# Patient Record
Sex: Male | Born: 1945 | Race: White | Hispanic: No | Marital: Married | Smoking: Never smoker
Health system: Southern US, Community
[De-identification: ages and names within clinical notes are randomized; demographics above are authoritative.]

## PROBLEM LIST (undated history)

## (undated) DIAGNOSIS — D693 Immune thrombocytopenic purpura: Secondary | ICD-10-CM

## (undated) DIAGNOSIS — R011 Cardiac murmur, unspecified: Secondary | ICD-10-CM

## (undated) DIAGNOSIS — I4891 Unspecified atrial fibrillation: Secondary | ICD-10-CM

## (undated) DIAGNOSIS — I10 Essential (primary) hypertension: Secondary | ICD-10-CM

## (undated) DIAGNOSIS — E119 Type 2 diabetes mellitus without complications: Secondary | ICD-10-CM

## (undated) DIAGNOSIS — D750 Familial erythrocytosis: Secondary | ICD-10-CM

## (undated) HISTORY — DX: Type 2 diabetes mellitus without complications: E11.9

## (undated) HISTORY — DX: Essential (primary) hypertension: I10

## (undated) HISTORY — DX: Unspecified atrial fibrillation: I48.91

## (undated) HISTORY — PX: ROTATOR CUFF REPAIR: SHX139

## (undated) HISTORY — DX: Immune thrombocytopenic purpura: D69.3

## (undated) HISTORY — PX: TONSILLECTOMY: SUR1361

## (undated) HISTORY — PX: HERNIA REPAIR: SHX51

## (undated) HISTORY — DX: Familial erythrocytosis: D75.0

## (undated) HISTORY — PX: CARDIAC ABLATION: SHX51081

## (undated) HISTORY — PX: OTHER SURGICAL HISTORY: SHX169

## (undated) HISTORY — DX: Cardiac murmur, unspecified: R01.1

---

## 1999-02-27 DIAGNOSIS — D693 Immune thrombocytopenic purpura: Secondary | ICD-10-CM

## 1999-02-27 HISTORY — DX: Immune thrombocytopenic purpura: D69.3

## 1999-02-27 HISTORY — PX: SPLENECTOMY, TOTAL: SHX788

## 2001-06-03 ENCOUNTER — Encounter: Admission: RE | Admit: 2001-06-03 | Discharge: 2001-09-01 | Payer: Self-pay | Admitting: Family Medicine

## 2001-09-22 ENCOUNTER — Encounter: Payer: Self-pay | Admitting: General Surgery

## 2001-09-24 ENCOUNTER — Encounter (INDEPENDENT_AMBULATORY_CARE_PROVIDER_SITE_OTHER): Payer: Self-pay | Admitting: Specialist

## 2001-09-24 ENCOUNTER — Inpatient Hospital Stay (HOSPITAL_COMMUNITY): Admission: RE | Admit: 2001-09-24 | Discharge: 2001-09-25 | Payer: Self-pay | Admitting: General Surgery

## 2007-01-02 ENCOUNTER — Ambulatory Visit: Payer: Self-pay | Admitting: Cardiology

## 2007-01-10 ENCOUNTER — Ambulatory Visit: Payer: Self-pay | Admitting: Cardiology

## 2007-01-27 ENCOUNTER — Ambulatory Visit: Payer: Self-pay | Admitting: Cardiology

## 2007-04-01 ENCOUNTER — Ambulatory Visit: Payer: Self-pay | Admitting: Cardiology

## 2007-04-07 ENCOUNTER — Ambulatory Visit: Payer: Self-pay | Admitting: Cardiology

## 2007-04-08 ENCOUNTER — Ambulatory Visit: Payer: Self-pay | Admitting: Cardiology

## 2007-04-14 ENCOUNTER — Ambulatory Visit: Payer: Self-pay | Admitting: Cardiology

## 2007-04-18 ENCOUNTER — Ambulatory Visit: Payer: Self-pay | Admitting: Cardiology

## 2007-04-22 ENCOUNTER — Ambulatory Visit: Payer: Self-pay | Admitting: Cardiology

## 2007-04-23 ENCOUNTER — Ambulatory Visit: Payer: Self-pay | Admitting: Cardiology

## 2007-05-06 ENCOUNTER — Ambulatory Visit: Payer: Self-pay | Admitting: Cardiology

## 2007-05-22 ENCOUNTER — Ambulatory Visit: Payer: Self-pay | Admitting: Cardiology

## 2007-05-29 ENCOUNTER — Ambulatory Visit: Payer: Self-pay | Admitting: Cardiology

## 2007-06-17 ENCOUNTER — Ambulatory Visit: Payer: Self-pay | Admitting: Cardiology

## 2007-07-11 ENCOUNTER — Ambulatory Visit: Payer: Self-pay | Admitting: Cardiology

## 2007-07-29 ENCOUNTER — Ambulatory Visit: Payer: Self-pay | Admitting: Cardiology

## 2007-08-18 ENCOUNTER — Ambulatory Visit: Payer: Self-pay | Admitting: Cardiology

## 2007-09-15 ENCOUNTER — Ambulatory Visit: Payer: Self-pay | Admitting: Cardiology

## 2007-10-22 ENCOUNTER — Ambulatory Visit: Payer: Self-pay | Admitting: Cardiology

## 2007-10-29 ENCOUNTER — Ambulatory Visit: Payer: Self-pay | Admitting: Cardiology

## 2007-11-05 ENCOUNTER — Ambulatory Visit: Payer: Self-pay | Admitting: Cardiology

## 2007-12-02 ENCOUNTER — Ambulatory Visit: Payer: Self-pay | Admitting: Cardiology

## 2007-12-05 ENCOUNTER — Encounter: Payer: Self-pay | Admitting: Cardiology

## 2007-12-09 ENCOUNTER — Ambulatory Visit: Payer: Self-pay | Admitting: Cardiology

## 2007-12-15 ENCOUNTER — Ambulatory Visit: Payer: Self-pay | Admitting: Cardiology

## 2008-01-06 ENCOUNTER — Ambulatory Visit: Payer: Self-pay | Admitting: Cardiology

## 2008-01-30 ENCOUNTER — Ambulatory Visit: Payer: Self-pay | Admitting: Cardiology

## 2008-02-06 ENCOUNTER — Ambulatory Visit: Payer: Self-pay | Admitting: Cardiology

## 2008-02-09 ENCOUNTER — Encounter: Payer: Self-pay | Admitting: Cardiology

## 2008-03-05 ENCOUNTER — Ambulatory Visit: Payer: Self-pay | Admitting: Cardiology

## 2008-03-18 ENCOUNTER — Ambulatory Visit: Payer: Self-pay | Admitting: Cardiology

## 2008-04-20 ENCOUNTER — Ambulatory Visit: Payer: Self-pay | Admitting: Cardiology

## 2008-05-14 ENCOUNTER — Ambulatory Visit: Payer: Self-pay | Admitting: Cardiology

## 2008-05-31 ENCOUNTER — Ambulatory Visit: Payer: Self-pay | Admitting: Cardiology

## 2008-06-01 ENCOUNTER — Ambulatory Visit: Payer: Self-pay | Admitting: Cardiology

## 2008-06-22 ENCOUNTER — Ambulatory Visit: Payer: Self-pay | Admitting: Cardiology

## 2008-06-26 ENCOUNTER — Encounter: Payer: Self-pay | Admitting: Cardiology

## 2008-07-13 ENCOUNTER — Ambulatory Visit: Payer: Self-pay | Admitting: Cardiology

## 2008-08-10 ENCOUNTER — Ambulatory Visit: Payer: Self-pay | Admitting: Cardiology

## 2008-08-20 ENCOUNTER — Encounter: Payer: Self-pay | Admitting: Cardiology

## 2008-09-03 ENCOUNTER — Ambulatory Visit: Payer: Self-pay | Admitting: Cardiology

## 2008-09-23 ENCOUNTER — Ambulatory Visit: Payer: Self-pay | Admitting: Cardiology

## 2008-10-01 ENCOUNTER — Ambulatory Visit: Payer: Self-pay | Admitting: Cardiology

## 2008-10-11 ENCOUNTER — Encounter: Payer: Self-pay | Admitting: *Deleted

## 2008-10-22 DIAGNOSIS — I480 Paroxysmal atrial fibrillation: Secondary | ICD-10-CM

## 2008-10-27 ENCOUNTER — Encounter: Payer: Self-pay | Admitting: Cardiology

## 2008-11-02 ENCOUNTER — Ambulatory Visit: Payer: Self-pay | Admitting: Cardiology

## 2008-11-02 LAB — CONVERTED CEMR LAB: POC INR: 2.7

## 2008-11-30 ENCOUNTER — Ambulatory Visit: Payer: Self-pay | Admitting: Cardiology

## 2008-11-30 LAB — CONVERTED CEMR LAB: POC INR: 2.6

## 2009-01-07 ENCOUNTER — Ambulatory Visit: Payer: Self-pay | Admitting: Cardiology

## 2009-01-07 LAB — CONVERTED CEMR LAB: POC INR: 4.3

## 2009-01-25 ENCOUNTER — Ambulatory Visit: Payer: Self-pay | Admitting: Cardiology

## 2009-01-25 LAB — CONVERTED CEMR LAB: POC INR: 2.6

## 2009-01-26 ENCOUNTER — Encounter: Payer: Self-pay | Admitting: Cardiology

## 2009-02-02 ENCOUNTER — Encounter: Payer: Self-pay | Admitting: Cardiology

## 2009-02-22 ENCOUNTER — Ambulatory Visit: Payer: Self-pay | Admitting: Cardiology

## 2009-02-22 LAB — CONVERTED CEMR LAB: POC INR: 3

## 2009-03-29 ENCOUNTER — Ambulatory Visit: Payer: Self-pay | Admitting: Cardiology

## 2009-03-29 LAB — CONVERTED CEMR LAB: POC INR: 1.8

## 2009-04-27 ENCOUNTER — Encounter: Payer: Self-pay | Admitting: Cardiology

## 2009-04-29 ENCOUNTER — Ambulatory Visit: Payer: Self-pay | Admitting: Cardiology

## 2009-04-29 LAB — CONVERTED CEMR LAB: POC INR: 2.1

## 2009-05-30 ENCOUNTER — Encounter: Payer: Self-pay | Admitting: Cardiology

## 2009-05-30 ENCOUNTER — Ambulatory Visit: Payer: Self-pay | Admitting: Cardiology

## 2009-05-30 LAB — CONVERTED CEMR LAB: POC INR: 2.4

## 2009-05-31 ENCOUNTER — Encounter: Payer: Self-pay | Admitting: Cardiology

## 2009-06-28 ENCOUNTER — Ambulatory Visit: Payer: Self-pay | Admitting: Cardiology

## 2009-06-28 LAB — CONVERTED CEMR LAB: POC INR: 3.4

## 2009-07-21 ENCOUNTER — Encounter: Payer: Self-pay | Admitting: Cardiology

## 2009-07-29 ENCOUNTER — Ambulatory Visit: Payer: Self-pay | Admitting: Cardiology

## 2009-07-29 LAB — CONVERTED CEMR LAB: POC INR: 2.4

## 2009-08-26 ENCOUNTER — Ambulatory Visit: Payer: Self-pay | Admitting: Cardiology

## 2009-08-26 LAB — CONVERTED CEMR LAB: POC INR: 2.2

## 2009-09-23 ENCOUNTER — Ambulatory Visit: Payer: Self-pay | Admitting: Cardiology

## 2009-09-26 ENCOUNTER — Encounter (INDEPENDENT_AMBULATORY_CARE_PROVIDER_SITE_OTHER): Payer: Self-pay | Admitting: *Deleted

## 2009-09-28 ENCOUNTER — Encounter: Payer: Self-pay | Admitting: Cardiology

## 2009-09-29 ENCOUNTER — Encounter: Payer: Self-pay | Admitting: Cardiology

## 2009-10-21 ENCOUNTER — Ambulatory Visit: Payer: Self-pay | Admitting: Cardiology

## 2009-10-21 LAB — CONVERTED CEMR LAB: POC INR: 3.1

## 2009-11-18 ENCOUNTER — Ambulatory Visit: Payer: Self-pay | Admitting: Cardiology

## 2009-11-18 LAB — CONVERTED CEMR LAB: POC INR: 2.9

## 2009-12-04 ENCOUNTER — Encounter: Payer: Self-pay | Admitting: Cardiology

## 2009-12-06 ENCOUNTER — Ambulatory Visit: Payer: Self-pay | Admitting: Cardiology

## 2009-12-06 ENCOUNTER — Encounter (INDEPENDENT_AMBULATORY_CARE_PROVIDER_SITE_OTHER): Payer: Self-pay | Admitting: *Deleted

## 2009-12-06 DIAGNOSIS — I10 Essential (primary) hypertension: Secondary | ICD-10-CM | POA: Insufficient documentation

## 2009-12-09 ENCOUNTER — Encounter: Payer: Self-pay | Admitting: Cardiology

## 2009-12-12 ENCOUNTER — Ambulatory Visit: Payer: Self-pay | Admitting: Cardiology

## 2009-12-12 ENCOUNTER — Encounter: Payer: Self-pay | Admitting: Cardiology

## 2009-12-13 ENCOUNTER — Encounter (INDEPENDENT_AMBULATORY_CARE_PROVIDER_SITE_OTHER): Payer: Self-pay | Admitting: *Deleted

## 2009-12-16 ENCOUNTER — Ambulatory Visit: Payer: Self-pay | Admitting: Cardiology

## 2010-01-24 ENCOUNTER — Ambulatory Visit: Payer: Self-pay | Admitting: Cardiology

## 2010-02-24 ENCOUNTER — Ambulatory Visit: Payer: Self-pay | Admitting: Cardiology

## 2010-02-24 LAB — CONVERTED CEMR LAB: POC INR: 3.1

## 2010-03-24 ENCOUNTER — Ambulatory Visit: Admission: RE | Admit: 2010-03-24 | Discharge: 2010-03-24 | Payer: Self-pay | Source: Home / Self Care

## 2010-03-24 LAB — CONVERTED CEMR LAB: POC INR: 2.4

## 2010-03-28 NOTE — Letter (Signed)
Summary: Internal Correspondence  Internal Correspondence   Imported By: Zachary George 12/09/2009 10:30:36  _____________________________________________________________________  External Attachment:    Type:   Image     Comment:   External Document

## 2010-03-28 NOTE — Medication Information (Signed)
Summary: ccr-lr  Anticoagulant Therapy  Managed by: Vashti Hey, RN PCP: Dr. Wyvonnia Lora Supervising MD: Myrtis Ser MD, Tinnie Gens Indication 1: Atrial Fibrillation (ICD-427.31) Lab [facility] [place] Site: [place] INR POC 3.1  Dietary changes: no    Health status changes: no    Bleeding/hemorrhagic complications: no    Recent/future hospitalizations: no    Any changes in medication regimen? no    Recent/future dental: no  Any missed doses?: no       Is patient compliant with meds? yes       Allergies: No Known Drug Allergies  Anticoagulation Management History:      The patient is taking warfarin and comes in today for a routine follow up visit.  Negative risk factors for bleeding include an age less than 22 years old.  The bleeding index is 'low risk'.  Negative CHADS2 values include Age > 50 years old.  The start date was 04/01/2007.  Anticoagulation responsible provider: Myrtis Ser MD, Tinnie Gens.  INR POC: 3.1.  Cuvette Lot#: [id number].  Exp: 10/11.    Anticoagulation Management Assessment/Plan:      The patient's current anticoagulation dose is Warfarin sodium 2.5 mg tabs: Use as directed by Anticoagualtion Clinic.  The target INR is 2 - 3.  The next INR is due 11/18/2009.  Anticoagulation instructions were given to patient.  Results were reviewed/authorized by Vashti Hey, RN.  He was notified by Vashti Hey RN.         Prior Anticoagulation Instructions: INR 2.6 Continue coumadin 7.5mg  once daily except 5mg  on M,F  Current Anticoagulation Instructions: INR 3.1 Continue coumadin 7.5mg  once daily except 5mg  on Mondays and Fridays

## 2010-03-28 NOTE — Medication Information (Signed)
Summary: ccr-lr pt r/s  Anticoagulant Therapy  Managed by: Vashti Hey, RN PCP: Dr. Wyvonnia Lora Supervising MD: Andee Lineman MD, Michelle Piper Indication 1: Atrial Fibrillation (ICD-427.31) Lab [facility]: [facility] [place] Site: [place] INR POC 2.9  Dietary changes: no    Health status changes: no    Bleeding/hemorrhagic complications: no    Recent/future hospitalizations: no    Any changes in medication regimen? no    Recent/future dental: no  Any missed doses?: no       Is patient compliant with meds? yes       Allergies: No Known Drug Allergies  Anticoagulation Management History:      The patient is taking warfarin and comes in today for a routine follow up visit.  Negative risk factors for bleeding include an age less than 34 years old.  The bleeding index is 'low risk'.  Negative CHADS2 values include Age > 48 years old.  The start date was 04/01/2007.  Anticoagulation responsible Mikelle Myrick: Andee Lineman MD, Michelle Piper.  INR POC: 2.9.  Cuvette Lot#: [id number].  Exp: 10/11.    Anticoagulation Management Assessment/Plan:      The patient's current anticoagulation dose is Warfarin sodium 2.5 mg tabs: Use as directed by Anticoagualtion Clinic.  The target INR is 2 - 3.  The next INR is due 12/16/2009.  Anticoagulation instructions were given to patient.  Results were reviewed/authorized by Vashti Hey, RN.  He was notified by Vashti Hey RN.         Prior Anticoagulation Instructions: INR 3.1 Continue coumadin 7.5mg  once daily except 5mg  on Mondays and Fridays  Current Anticoagulation Instructions: INR 2.9 Continue coumadin 7.5mg  once daily except 5mg  on Mondays and Fridays

## 2010-03-28 NOTE — Letter (Signed)
Summary: External Correspondence/ FAXED DR. HRANITZKY AT [facility]  External Correspondence/ FAXED DR. HRANITZKY AT [facility]   Imported By: Dorise Hiss 09/29/2009 08:55:04  _____________________________________________________________________  External Attachment:    Type:   Image     Comment:   External Document

## 2010-03-28 NOTE — Medication Information (Signed)
Summary: CCR PER 4/4 CHECK-JM  Anticoagulant Therapy  Managed by: Vashti Hey, RN PCP: Dr. Wyvonnia Lora Supervising MD: Diona Browner MD, Remi Deter Indication 1: Atrial Fibrillation (ICD-427.31) Lab [facility] [place] Site: [place] INR POC 3.4  Dietary changes: no    Health status changes: no    Bleeding/hemorrhagic complications: no    Recent/future hospitalizations: no    Any changes in medication regimen? no    Recent/future dental: no  Any missed doses?: no       Is patient compliant with meds? yes       Allergies: No Known Drug Allergies  Anticoagulation Management History:      The patient is taking warfarin and comes in today for a routine follow up visit.  Negative risk factors for bleeding include an age less than 51 years old.  The bleeding index is 'low risk'.  Negative CHADS2 values include Age > 15 years old.  The start date was 04/01/2007.  Anticoagulation responsible provider: Diona Browner MD, Remi Deter.  INR POC: 3.4.  Cuvette Lot#: [id number].  Exp: 10/11.    Anticoagulation Management Assessment/Plan:      The patient's current anticoagulation dose is Warfarin sodium 2.5 mg tabs: Use as directed by Anticoagualtion Clinic.  The target INR is 2 - 3.  The next INR is due 07/29/2009.  Anticoagulation instructions were given to patient.  Results were reviewed/authorized by Vashti Hey, RN.  He was notified by Vashti Hey RN.         Prior Anticoagulation Instructions: INR 2.4 Continue coumadin 7.5mg  once daily except 5mg  on Mondays and Fridays  Current Anticoagulation Instructions: INR 3.4 Take coumadin 2.5mg  tonight then resume 7.5mg  once daily except 5mg  on M,F

## 2010-03-28 NOTE — Miscellaneous (Signed)
Summary: Orders Update  Clinical Lists Changes  Orders: Added new Test order of T-Hepatic Function [phone]) - Signed Added new Test order of T-CBC No Diff ([id number]) - Signed

## 2010-03-28 NOTE — Letter (Signed)
Summary: External Correspondence/ OFFICE NOTE DR. HRANITZKY  External Correspondence/ OFFICE NOTE DR. HRANITZKY   Imported By: Dorise Hiss 05/02/2009 15:47:44  _____________________________________________________________________  External Attachment:    Type:   Image     Comment:   External Document

## 2010-03-28 NOTE — Letter (Signed)
Summary: Lexiscan or Dobutamine Pharmacist, community at [facility]  [address]   [place], [place] [address]   Phone: [phone]  Fax: [phone]      [facility] Cardiovascular Services  Lexiscan or Dobutamine Cardiolite Strss Test    Daxter Demorest  Appointment Date:_  Appointment Time:_  Your doctor has ordered a CARDIOLITE STRESS TEST using a medication to stimulate exercise so that you will not have to walk on the treadmill to determine the condition of your heart during stress. If you take blood pressure medication, ask your doctor if you should take it the day of your test. You should not have anything to eat or drink at least 4 hours before your test is scheduled, and no caffeine, including decaffeinated tea and coffee, chocolate, and soft drinks for 24 hours before your test.  You will need to register at the Outpatient/Main Entrance at the hospital 15 minutes before your appointment time. It is a good idea to bring a copy of your order with you. They will direct you to the Diagnostic Imaging (Radiology) Department.  You will be asked to undress from the waist up and given a hospital gown to wear, so dress comfortably from the waist down for example: Sweat pants, shorts, or skirt Rubber soled lace up shoes (tennis shoes)  Plan on about three hours from registration to release from the hospital  You may take your medications with water the morning of your test.  If the results of your test are normal or stable, you will receive a letter. If they are abnormal, the nurse will contact you by phone.

## 2010-03-28 NOTE — Letter (Signed)
Summary: Engineer, materials at [facility]  [address]   [place], [place] [address]   Phone: [phone]  Fax: [phone]        December 13, 2009 MRN: [id number]    [name] [address] [place], [place]  [address]    Dear [name],  Your test ordered by Selena Batten has been reviewed by your physician (or physician assistant) and was found to be normal or stable. Your physician (or physician assistant) felt no changes were needed at this time.  ____ Echocardiogram  __X__ Cardiac Stress Test  ____ Lab Work  ____ Peripheral vascular study of arms, legs or neck  ____ CT scan or X-ray  ____ Lung or Breathing test  ____ Other:    Thank you.   Cyril Loosen, RN, BSN    Duane Boston, M.D., F.A.C.C. Thressa Sheller, M.D., F.A.C.C. Oneal Grout, M.D., F.A.C.C. Cheree Ditto, M.D., F.A.C.C. Daiva Nakayama, M.D., F.A.C.C. Kenney Houseman, M.D., F.A.C.C. Jeanne Ivan, PA-C

## 2010-03-28 NOTE — Medication Information (Signed)
Summary: ccr-lr  Anticoagulant Therapy  Managed by: Richard Hey, RN PCP: Daine Gravel MD: Andee Lineman MD, Michelle Piper Indication 1: Atrial Fibrillation (ICD-427.31) Lab [facility] [place] Site: [place] INR POC 2.1  Dietary changes: no    Health status changes: no    Bleeding/hemorrhagic complications: no    Recent/future hospitalizations: no    Any changes in medication regimen? no    Recent/future dental: no  Any missed doses?: no       Is patient compliant with meds? yes       Anticoagulation Management History:      The patient is taking warfarin and comes in today for a routine follow up visit.  Negative risk factors for bleeding include an age less than 33 years old.  The bleeding index is 'low risk'.  Negative CHADS2 values include Age > 66 years old.  The start date was 04/01/2007.  Anticoagulation responsible provider: Andee Lineman MD, Michelle Piper.  INR POC: 2.1.  Cuvette Lot#: [id number].  Exp: 10/11.    Anticoagulation Management Assessment/Plan:      The patient's current anticoagulation dose is Warfarin sodium 2.5 mg tabs: Use as directed by Anticoagualtion Clinic.  The target INR is 2 - 3.  The next INR is due 05/31/2009.  Anticoagulation instructions were given to patient.  Results were reviewed/authorized by Richard Hey, RN.  He was notified by Richard Hey RN.         Prior Anticoagulation Instructions: INR 1.8 Take coumadin 4 tablets tonight and tomorrow night then resume 7.5mg  once daily except 5mg  on M,W,F  Current Anticoagulation Instructions: INR 2.1 Increase coumadin to 7.5mg  once daily except 5mg  on Mondays and Fridays

## 2010-03-28 NOTE — Procedures (Signed)
Summary: Holter and Event/ CARDIONET  Holter and Event/ CARDIONET   Imported By: Dorise Hiss 05/27/2009 14:24:20  _____________________________________________________________________  External Attachment:    Type:   Image     Comment:   External Document

## 2010-03-28 NOTE — Medication Information (Signed)
Summary: ccr-lr  Anticoagulant Therapy  Managed by: Vashti Hey, RN PCP: Dr. Wyvonnia Lora Supervising MD: Diona Browner MD, Remi Deter Indication 1: Atrial Fibrillation (ICD-427.31) Lab [facility] [place] Site: [place] INR POC 2.6  Dietary changes: no    Health status changes: no    Bleeding/hemorrhagic complications: no    Recent/future hospitalizations: no    Any changes in medication regimen? no    Recent/future dental: no  Any missed doses?: no       Is patient compliant with meds? yes       Allergies: No Known Drug Allergies  Anticoagulation Management History:      The patient is taking warfarin and comes in today for a routine follow up visit.  Negative risk factors for bleeding include an age less than 61 years old.  The bleeding index is 'low risk'.  Negative CHADS2 values include Age > 71 years old.  The start date was 04/01/2007.  Anticoagulation responsible provider: Diona Browner MD, Remi Deter.  INR POC: 2.6.  Cuvette Lot#: [id number].  Exp: 10/11.    Anticoagulation Management Assessment/Plan:      The patient's current anticoagulation dose is Warfarin sodium 2.5 mg tabs: Use as directed by Anticoagualtion Clinic.  The target INR is 2 - 3.  The next INR is due 10/21/2009.  Anticoagulation instructions were given to patient.  Results were reviewed/authorized by Vashti Hey, RN.  He was notified by Vashti Hey RN.         Prior Anticoagulation Instructions: INR 2.2 Continue coumadin 7.5mg  once daily except 5mg  on Mondays and Fridays  Current Anticoagulation Instructions: INR 2.6 Continue coumadin 7.5mg  once daily except 5mg  on M,F

## 2010-03-28 NOTE — Medication Information (Signed)
Summary: ccr-lr  Anticoagulant Therapy  Managed by: Vashti Hey, RN PCP: Daine Gravel MD: Andee Lineman MD, Michelle Piper Indication 1: Atrial Fibrillation (ICD-427.31) Lab [facility] [place] Site: [place] INR POC 1.8  Dietary changes: no    Health status changes: no    Bleeding/hemorrhagic complications: no    Recent/future hospitalizations: no    Any changes in medication regimen? no    Recent/future dental: no  Any missed doses?: no       Is patient compliant with meds? yes       Anticoagulation Management History:      The patient is taking warfarin and comes in today for a routine follow up visit.  Negative risk factors for bleeding include an age less than 40 years old.  The bleeding index is 'low risk'.  Negative CHADS2 values include Age > 72 years old.  The start date was 04/01/2007.  Anticoagulation responsible provider: Andee Lineman MD, Michelle Piper.  INR POC: 1.8.  Cuvette Lot#: [id number].  Exp: 10/11.    Anticoagulation Management Assessment/Plan:      The patient's current anticoagulation dose is Warfarin sodium 2.5 mg tabs: Use as directed by Anticoagualtion Clinic.  The target INR is 2 - 3.  The next INR is due 04/19/2009.  Anticoagulation instructions were given to patient.  Results were reviewed/authorized by Vashti Hey, RN.  He was notified by Vashti Hey RN.         Prior Anticoagulation Instructions: INR 3.0 Continue coumadin 7.5mg  once daily except 5mg  on M,W,F  Current Anticoagulation Instructions: INR 1.8 Take coumadin 4 tablets tonight and tomorrow night then resume 7.5mg  once daily except 5mg  on M,W,F

## 2010-03-28 NOTE — Medication Information (Signed)
Summary: ccr-lr  Anticoagulant Therapy  Managed by: Vashti Hey, RN PCP: Dr. Wyvonnia Lora Supervising MD: Andee Lineman MD, Michelle Piper Indication 1: Atrial Fibrillation (ICD-427.31) Lab [facility] [place] Site: [place] INR POC 2.4  Dietary changes: no    Health status changes: yes       Details: fell off ladder  Hit head, had hematoma on rt arm and pelvic area  Went to ED and had CT scan   scan was negative  Bleeding/hemorrhagic complications: no    Recent/future hospitalizations: yes       Details: At [facility] ED for Ct scans  all negative  Any changes in medication regimen? no    Recent/future dental: no  Any missed doses?: yes     Details: coumadin was held 1 night  Is patient compliant with meds? yes       Allergies: No Known Drug Allergies  Anticoagulation Management History:      The patient is taking warfarin and comes in today for a routine follow up visit.  Negative risk factors for bleeding include an age less than 73 years old.  The bleeding index is 'low risk'.  Negative CHADS2 values include Age > 63 years old.  The start date was 04/01/2007.  Anticoagulation responsible provider: Andee Lineman MD, Michelle Piper.  INR POC: 2.4.  Cuvette Lot#: [id number].  Exp: 10/11.    Anticoagulation Management Assessment/Plan:      The patient's current anticoagulation dose is Warfarin sodium 2.5 mg tabs: Use as directed by Anticoagualtion Clinic.  The target INR is 2 - 3.  The next INR is due 08/26/2009.  Anticoagulation instructions were given to patient.  Results were reviewed/authorized by Vashti Hey, RN.  He was notified by Vashti Hey RN.         Prior Anticoagulation Instructions: INR 3.4 Take coumadin 2.5mg  tonight then resume 7.5mg  once daily except 5mg  on M,F  Current Anticoagulation Instructions: INR 2.4 Continue coumadin 7.5mg  once daily except 5mg  on Mondays and Fridays

## 2010-03-28 NOTE — Miscellaneous (Signed)
Summary: Orders Update  Clinical Lists Changes  Orders: Added new Referral order of GXT (GXT) - Signed 

## 2010-03-28 NOTE — Medication Information (Signed)
Summary: Coumadin Clinic  Anticoagulant Therapy  Managed by: Vashti Hey, RN PCP: Daine Gravel MD: Antoine Poche MD, Fayrene Fearing Indication 1: Atrial Fibrillation (ICD-427.31) Lab Used: [facility] [place] Site: [place] INR POC 2.4  Dietary changes: no    Health status changes: no    Bleeding/hemorrhagic complications: no    Recent/future hospitalizations: no    Any changes in medication regimen? no    Recent/future dental: no  Any missed doses?: no       Is patient compliant with meds? yes       Allergies: No Known Drug Allergies  Anticoagulation Management History:      The patient is taking warfarin and comes in today for a routine follow up visit.  Negative risk factors for bleeding include an age less than 58 years old.  The bleeding index is 'low risk'.  Negative CHADS2 values include Age > 29 years old.  The start date was 04/01/2007.  Anticoagulation responsible provider: Antoine Poche MD, Fayrene Fearing.  INR POC: 2.4.  Cuvette Lot#: [id number].  Exp: 10/11.    Anticoagulation Management Assessment/Plan:      The patient's current anticoagulation dose is Warfarin sodium 2.5 mg tabs: Use as directed by Anticoagualtion Clinic.  The target INR is 2 - 3.  The next INR is due 06/28/2009.  Anticoagulation instructions were given to patient.  Results were reviewed/authorized by Vashti Hey, RN.  He was notified by Vashti Hey RN.         Prior Anticoagulation Instructions: INR 2.1 Increase coumadin to 7.5mg  once daily except 5mg  on Mondays and Fridays  Current Anticoagulation Instructions: INR 2.4 Continue coumadin 7.5mg  once daily except 5mg  on Mondays and Fridays

## 2010-03-28 NOTE — Assessment & Plan Note (Signed)
Summary: 6 mo  Medications Added LOSARTAN POTASSIUM 100 MG TABS (LOSARTAN POTASSIUM) Take 1 tablet by mouth once a day      Allergies Added: NKDA  Visit Type:  Follow-up Primary Provider:  Dr. Wyvonnia Lora   History of Present Illness: 65 year old male presents for follow-up. He was seen back in April. Reports occasional palpitations, not prolonged, with and without activity. No specific exertional chest pain. Reports NYHA class II dyspnea on exertion which is stable.  Does not endorse any major bleeding problems on Coumadin. His medical regimen is stable, outlined below.  Labs from August showed AST 27, ALT 22, hemoglobin 16.6, platelets 121. These were forwarded to [facility].  Last ischemic evaluation was in 2009. He continues on flecainide. No followup ischemic surveillance since then.  Clinical Review Panels:  Stress Echocardiogram Stress Echocardiogram Normal stress test, with no ECG changes consistent with ischemia.          There was a normal heart rate response, with a normal blood pressure         response.  The patient experienced no chest pain. The test was         terminated due to fatigue and dyspnea.  A good level of stress was         achieved. No prior study was available for comparison.  (04/23/2007)    Preventive Screening-Counseling & Management  Alcohol-Tobacco     Smoking Status: quit     Year Quit: 2000  Current Medications (verified): 1)  Zyrtec Allergy 10 Mg Tabs (Cetirizine Hcl) .... Take 1 Tablet By Mouth Once A Day 2)  Verapamil Hcl Cr 180 Mg Cr-Tabs (Verapamil Hcl) .... Take 1 Tab By Mouth At Bedtime 3)  Losartan Potassium 100 Mg Tabs (Losartan Potassium) .... Take 1 Tablet By Mouth Once A Day 4)  Metformin Hcl 500 Mg Tabs (Metformin Hcl) .... Take 1 Tablet By Mouth Once A Day 5)  Pravastatin Sodium 40 Mg Tabs (Pravastatin Sodium) .... Take One Tablet By Mouth Daily At Bedtime 6)  Hydroxyurea 500 Mg Caps (Hydroxyurea) .... Take 1 Tablet By Mouth  Three Times A Week 7)  Warfarin Sodium 2.5 Mg Tabs (Warfarin Sodium) .... Use As Directed By Anticoagualtion Clinic 8)  Allergy Shots .... Weekly 9)  Calcium + D 600-200 Mg-Unit Tabs (Calcium Carbonate-Vitamin D) .... Take 1 Tablet By Mouth Two Times A Day 10)  Fish Oil 1000 Mg Caps (Omega-3 Fatty Acids) .... Take 1 Tablet By Mouth Two Times A Day 11)  Optivar 0.05 % Soln (Azelastine Hcl) .... As Needed 12)  Astelin 137 Mcg/spray Soln (Azelastine Hcl) .... As Needed 13)  Tylenol 325 Mg Tabs (Acetaminophen) .... As Needed 14)  Flecainide Acetate 100 Mg Tabs (Flecainide Acetate) .... Take 1 Tablet By Mouth Two Times A Day  Allergies (verified): No Known Drug Allergies  Comments:  Nurse/Medical Assistant: The patient's medication list and allergies were reviewed with the patient and were updated in the Medication and Allergy Lists.  Past History:  Past Medical History: Last updated: 05/27/2009 Atrial Fibrillation - ablation at [facility] 2009, Dr. Deno Lunger Diabetes Type 2 Hypertension ITP 2001 Essential erythrocytosis  Past Surgical History: Last updated: 05/27/2009 Rotator Cuff Repair Spleenectomy 2001 Tonsillectomy Herniorrhaphy  Social History: Last updated: 05/27/2009 Retired  Married  Tobacco Use - Former Alcohol Use - no Regular Exercise - no Drug Use - no  Review of Systems  The patient denies anorexia, fever, chest pain, syncope, dyspnea on exertion, headaches, hemoptysis, melena,  hematochezia, and severe indigestion/heartburn.         States that he hyperextended his right elbow recently. Gradually improving. Otherwise reviewed and negative except as outlined.  Vital Signs:  Patient profile:   65 year old male Height:      73 inches Weight:      226 pounds Pulse rate:   71 / minute BP sitting:   123 / 77  (left arm) Cuff size:   large  Vitals Entered By: Carlye Grippe (December 06, 2009 2:03 PM)  Physical Exam  Additional Exam:  Well-nourished male in  no acute distress. HEENT: Conjunctiva and lids normal, oropharynx clear. Neck: Supple, no carotid bruits, no elevated JVP. Lungs: Clear to auscultation, nonlabored. Cardiac: Regular rate and rhythm, no S3. Abdomen: Soft, nontender. Skin: Warm and dry. Extremities: No pitting edema. Distal pulses are full. Musculoskeletal: No lower extremity pitting edema. Neuropsychiatric: Alert and oriented x3, affect appropriate.   EKG  Procedure date:  12/06/2009  Findings:      Sinus rhythm at 72 beats per minute with PR interval 232 ms, otherwise normal. QTC 440 ms.  Impression & Recommendations:  Problem # 1:  ATRIAL FIBRILLATION (ICD-427.31)  Paroxysmal, in sinus rhythm today. Plan to continue present medical regimen. Followup ischemic surveillance testing on flecainide to be  accomplished via Lexiscan Cardiolite. Anticipate followup in 6 months, sooner if needed. He continues to follow at [facility].  His updated medication list for this problem includes:    Warfarin Sodium 2.5 Mg Tabs (Warfarin sodium) ..... Use as directed by anticoagualtion clinic    Flecainide Acetate 100 Mg Tabs (Flecainide acetate) .Marland Kitchen... Take 1 tablet by mouth two times a day  Orders: EKG w/ Interpretation (93000) Nuclear Med (Nuc Med)  Problem # 2:  ESSENTIAL HYPERTENSION, BENIGN (ICD-401.1)  Blood pressure reasonably controlled today.  His updated medication list for this problem includes:    Verapamil Hcl Cr 180 Mg Cr-tabs (Verapamil hcl) .Marland Kitchen... Take 1 tab by mouth at bedtime    Losartan Potassium 100 Mg Tabs (Losartan potassium) .Marland Kitchen... Take 1 tablet by mouth once a day  Patient Instructions: 1)  Your physician wants you to follow-up in: 6 months. You will receive a reminder letter in the mail one-two months in advance. If you don't receive a letter, please call our office to schedule the follow-up appointment. 2)  Your physician has requested that you have an Best boy.  For further information please  visit [email or web address]  Please follow instruction sheet, as given.

## 2010-03-28 NOTE — Medication Information (Signed)
Summary: ccr-lr  Anticoagulant Therapy  Managed by: Vashti Hey, RN PCP: Dr. Wyvonnia Lora Supervising MD: Antoine Poche MD, Fayrene Fearing Indication 1: Atrial Fibrillation (ICD-427.31) [facility] [place] Site: [place] INR POC 2.2  Dietary changes: no    Health status changes: no    Bleeding/hemorrhagic complications: no    Recent/future hospitalizations: no    Any changes in medication regimen? no    Recent/future dental: no  Any missed doses?: no       Is patient compliant with meds? yes       Allergies: No Known Drug Allergies  Anticoagulation Management History:      The patient is taking warfarin and comes in today for a routine follow up visit.  Negative risk factors for bleeding include an age less than 40 years old.  The bleeding index is 'low risk'.  Negative CHADS2 values include Age > 5 years old.  The start date was 04/01/2007.  Anticoagulation responsible provider: Antoine Poche MD, Fayrene Fearing.  INR POC: 2.2.  Cuvette Lot#: [id number].  Exp: 10/11.    Anticoagulation Management Assessment/Plan:      The patient's current anticoagulation dose is Warfarin sodium 2.5 mg tabs: Use as directed by Anticoagualtion Clinic.  The target INR is 2 - 3.  The next INR is due 09/23/2009.  Anticoagulation instructions were given to patient.  Results were reviewed/authorized by Vashti Hey, RN.  He was notified by Vashti Hey RN.         Prior Anticoagulation Instructions: INR 2.4 Continue coumadin 7.5mg  once daily except 5mg  on Mondays and Fridays  Current Anticoagulation Instructions: INR 2.2 Continue coumadin 7.5mg  once daily except 5mg  on Mondays and Fridays

## 2010-03-28 NOTE — Assessment & Plan Note (Signed)
Summary: 6 mo fu per feb reminder-srs  Medications Added FLECAINIDE ACETATE 100 MG TABS (FLECAINIDE ACETATE) Take 1 tablet by mouth two times a day      Allergies Added: NKDA  Visit Type:  Follow-up Primary Provider:  Dr. Wyvonnia Lora   History of Present Illness: 65 year old male presents for a followup visit. He was seen most recently at [facility] in early March and was doing well on a combination of flecainide and verapamil. This remains the case. He can recall perhaps one very brief episode of palpitations since being on this regimen.  He did have some bleeding and ecchymosis associated with an abrasion on his left leg following an injury. Otherwise no spontaneous bleeding problems on Coumadin.  He has had no chest pain or unusual shortness of breath.  Current Medications (verified): 1)  Zyrtec Allergy 10 Mg Tabs (Cetirizine Hcl) .... Take 1 Tablet By Mouth Once A Day 2)  Verapamil Hcl Cr 180 Mg Cr-Tabs (Verapamil Hcl) .... Take 1 Tab By Mouth At Bedtime 3)  Avapro 300 Mg Tabs (Irbesartan) .... Take 1 Tablet By Mouth Once A Day 4)  Metformin Hcl 500 Mg Tabs (Metformin Hcl) .... Take 1 Tablet By Mouth Once A Day 5)  Pravastatin Sodium 40 Mg Tabs (Pravastatin Sodium) .... Take One Tablet By Mouth Daily At Bedtime 6)  Hydroxyurea 500 Mg Caps (Hydroxyurea) .... Take 1 Tablet By Mouth Three Times A Week 7)  Warfarin Sodium 2.5 Mg Tabs (Warfarin Sodium) .... Use As Directed By Anticoagualtion Clinic 8)  Allergy Shots .... Weekly 9)  Calcium + D 600-200 Mg-Unit Tabs (Calcium Carbonate-Vitamin D) .... Take 1 Tablet By Mouth Two Times A Day 10)  Fish Oil 1000 Mg Caps (Omega-3 Fatty Acids) .... Take 1 Tablet By Mouth Two Times A Day 11)  Optivar 0.05 % Soln (Azelastine Hcl) .... As Needed 12)  Astelin 137 Mcg/spray Soln (Azelastine Hcl) .... As Needed 13)  Tylenol 325 Mg Tabs (Acetaminophen) .... As Needed 14)  Flecainide Acetate 100 Mg Tabs (Flecainide Acetate) .... Take 1 Tablet By Mouth Two  Times A Day  Allergies (verified): No Known Drug Allergies  Comments:  Nurse/Medical Assistant: The patient's medications and allergies were reviewed with the patient and were updated in the Medication and Allergy Lists. List reviewed.  Past History:  Past Medical History: Last updated: 05/27/2009 Atrial Fibrillation - ablation at [facility] 2009, Dr. Deno Lunger Diabetes Type 2 Hypertension ITP 2001 Essential erythrocytosis  Social History: Last updated: 05/27/2009 Retired  Married  Tobacco Use - Former Alcohol Use - no Regular Exercise - no Drug Use - no  Clinical Review Panels:  Stress Echocardiogram Stress Echocardiogram Normal stress test, with no ECG changes consistent with ischemia.          There was a normal heart rate response, with a normal blood pressure         response.  The patient experienced no chest pain. The test was         terminated due to fatigue and dyspnea.  A good level of stress was         achieved. No prior study was available for comparison.  (04/23/2007)    Review of Systems  The patient denies anorexia, fever, weight loss, chest pain, syncope, dyspnea on exertion, headaches, hemoptysis, melena, and hematochezia.         Otherwise reviewed and negative.  Vital Signs:  Patient profile:   65 year old male Height:      29  inches Weight:      224 pounds BMI:     29.66 Pulse rate:   75 / minute BP sitting:   100 / 65  (left arm) Cuff size:   large  Vitals Entered By: Carlye Grippe (May 30, 2009 3:33 PM)  Nutrition Counseling: Patient's BMI is greater than 25 and therefore counseled on weight management options.  Physical Exam  Additional Exam:  Well-nourished male in no acute distress. HEENT: Conjunctiva and lids normal, oropharynx clear. Neck: Supple, no carotid bruits, no elevated JVP. Lungs: Clear to auscultation, nonlabored. Cardiac: Regular rate and rhythm, no S3. Abdomen: Soft, nontender. Skin: Warm and dry. Extremity: No  pitting edema. Distal pulses are full.   Impression & Recommendations:  Problem # 1:  ATRIAL FIBRILLATION (ICD-427.31)  Symptomatically stable with present approach including verapamil CR, standing flecainide, and Coumadin. He will continue to follow with Dr. Deno Lunger at [facility]. Followup CBC and LFTs will be obtained in August, with results to him. I otherwise plan to see him back in 6 months. He will ultimately need a followup ischemic workup down the road, if he remains on flecainide.  His updated medication list for this problem includes:    Warfarin Sodium 2.5 Mg Tabs (Warfarin sodium) ..... Use as directed by anticoagualtion clinic    Flecainide Acetate 100 Mg Tabs (Flecainide acetate) .Marland Kitchen... Take 1 tablet by mouth two times a day  Orders: EKG w/ Interpretation (93000)  Patient Instructions: 1)  Your physician wants you to follow-up in: 6 months. You will receive a reminder letter in the mail one-two months in advance. If you don't receive a letter, please call our office to schedule the follow-up appointment. 2)  Your physician recommends that you continue on your current medications as directed. Please refer to the Current Medication list given to you today. 3)  Your physician recommends that you go to the [facility] for lab work IN [place].

## 2010-03-28 NOTE — Medication Information (Signed)
Summary: ccr-lr  Anticoagulant Therapy  Managed by: Vashti Hey, RN PCP: Dr. Wyvonnia Lora Supervising MD: Andee Lineman MD, Michelle Piper Indication 1: Atrial Fibrillation (ICD-427.31) Lab [facility]: [facility] [place] Site: [place] INR POC 2.7  Dietary changes: no    Health status changes: no    Bleeding/hemorrhagic complications: no    Recent/future hospitalizations: no    Any changes in medication regimen? no    Recent/future dental: no  Any missed doses?: no       Is patient compliant with meds? yes       Allergies: No Known Drug Allergies  Anticoagulation Management History:      The patient is taking warfarin and comes in today for a routine follow up visit.  Negative risk factors for bleeding include an age less than 65 years old.  The bleeding index is 'low risk'.  Positive CHADS2 values include History of HTN.  Negative CHADS2 values include Age > 47 years old.  The start date was 04/01/2007.  Anticoagulation responsible provider: Andee Lineman MD, Michelle Piper.  INR POC: 2.7.  Cuvette Lot#: [id number].  Exp: 10/11.    Anticoagulation Management Assessment/Plan:      The patient's current anticoagulation dose is Warfarin sodium 2.5 mg tabs: Use as directed by Anticoagualtion Clinic.  The target INR is 2 - 3.  The next INR is due 02/24/2010.  Anticoagulation instructions were given to patient.  Results were reviewed/authorized by Vashti Hey, RN.  He was notified by Vashti Hey RN.         Prior Anticoagulation Instructions: INR 3.3 Take coumadin 2.5mg  tonight then resume 7.5mg  once daily except 5mg  on Mondays and Fridays  Current Anticoagulation Instructions: INR 2.7 Continue coumadin 7.5mg  once daily except 5mg  on Mondays and Fridays

## 2010-03-28 NOTE — Medication Information (Signed)
Summary: ccr-lr  Anticoagulant Therapy  Managed by: Vashti Hey, RN PCP: Dr. Wyvonnia Lora Supervising MD: Andee Lineman MD, Michelle Piper Indication 1: Atrial Fibrillation (ICD-427.31) Lab [facility]: [facility] [place] Site: [place] INR POC 3.3  Dietary changes: no    Health status changes: no    Bleeding/hemorrhagic complications: no    Recent/future hospitalizations: no    Any changes in medication regimen? no    Recent/future dental: no  Any missed doses?: no       Is patient compliant with meds? yes       Allergies: No Known Drug Allergies  Anticoagulation Management History:      The patient is taking warfarin and comes in today for a routine follow up visit.  Negative risk factors for bleeding include an age less than 59 years old.  The bleeding index is 'low risk'.  Positive CHADS2 values include History of HTN.  Negative CHADS2 values include Age > 33 years old.  The start date was 04/01/2007.  Anticoagulation responsible provider: Andee Lineman MD, Michelle Piper.  INR POC: 3.3.  Cuvette Lot#: [id number].  Exp: 10/11.    Anticoagulation Management Assessment/Plan:      The patient's current anticoagulation dose is Warfarin sodium 2.5 mg tabs: Use as directed by Anticoagualtion Clinic.  The target INR is 2 - 3.  The next INR is due 01/17/2010.  Anticoagulation instructions were given to patient.  Results were reviewed/authorized by Vashti Hey, RN.  He was notified by Vashti Hey RN.         Prior Anticoagulation Instructions: INR 2.9 Continue coumadin 7.5mg  once daily except 5mg  on Mondays and Fridays  Current Anticoagulation Instructions: INR 3.3 Take coumadin 2.5mg  tonight then resume 7.5mg  once daily except 5mg  on Mondays and Fridays

## 2010-03-30 NOTE — Medication Information (Signed)
Summary: ccr-lr  Anticoagulant Therapy  Managed by: Vashti Hey, RN PCP: Dr. Wyvonnia Lora Supervising MD: Diona Browner MD, Remi Deter Indication 1: Atrial Fibrillation (ICD-427.31) Lab [facility]: [facility] [place] Site: [place] INR POC 3.1  Dietary changes: no    Health status changes: no    Bleeding/hemorrhagic complications: no    Recent/future hospitalizations: no    Any changes in medication regimen? no    Recent/future dental: no  Any missed doses?: no       Is patient compliant with meds? yes       Allergies: No Known Drug Allergies  Anticoagulation Management History:      The patient is taking warfarin and comes in today for a routine follow up visit.  Negative risk factors for bleeding include an age less than 32 years old.  The bleeding index is 'low risk'.  Positive CHADS2 values include History of HTN.  Negative CHADS2 values include Age > 34 years old.  The start date was 04/01/2007.  Anticoagulation responsible provider: Diona Browner MD, Remi Deter.  INR POC: 3.1.  Cuvette Lot#: [id number].  Exp: 10/11.    Anticoagulation Management Assessment/Plan:      The patient's current anticoagulation dose is Warfarin sodium 2.5 mg tabs: Use as directed by Anticoagualtion Clinic.  The target INR is 2 - 3.  The next INR is due 03/24/2010.  Anticoagulation instructions were given to patient.  Results were reviewed/authorized by Vashti Hey, RN.  He was notified by Vashti Hey RN.         Prior Anticoagulation Instructions: INR 2.7 Continue coumadin 7.5mg  once daily except 5mg  on Mondays and Fridays  Current Anticoagulation Instructions: INR 3.1 Continue coumadin 7.5mg  once daily except 5mg  on Mondays and Fridays

## 2010-03-30 NOTE — Medication Information (Signed)
Summary: ccr-lr  Anticoagulant Therapy  Managed by: Vashti Hey, RN Referring MD: Diona Browner PCP: Dr. Wyvonnia Lora Supervising MD: Andee Lineman MD, Michelle Piper Indication 1: Atrial Fibrillation (ICD-427.31) Lab [facility] [place] Site: [place] INR POC 2.4  Dietary changes: no    Health status changes: no    Bleeding/hemorrhagic complications: no    Recent/future hospitalizations: no    Any changes in medication regimen? no    Recent/future dental: no  Any missed doses?: no       Is patient compliant with meds? yes       Allergies: No Known Drug Allergies  Anticoagulation Management History:      The patient is taking warfarin and comes in today for a routine follow up visit.  Negative risk factors for bleeding include an age less than 60 years old.  The bleeding index is 'low risk'.  Positive CHADS2 values include History of HTN.  Negative CHADS2 values include Age > 79 years old.  The start date was 04/01/2007.  Anticoagulation responsible provider: Andee Lineman MD, Michelle Piper.  INR POC: 2.4.  Cuvette Lot#: [id number].  Exp: 10/11.    Anticoagulation Management Assessment/Plan:      The patient's current anticoagulation dose is Warfarin sodium 2.5 mg tabs: Use as directed by Anticoagualtion Clinic.  The target INR is 2 - 3.  The next INR is due 04/25/2010.  Anticoagulation instructions were given to patient.  Results were reviewed/authorized by Vashti Hey, RN.  He was notified by Vashti Hey RN.         Prior Anticoagulation Instructions: INR 3.1 Continue coumadin 7.5mg  once daily except 5mg  on Mondays and Fridays  Current Anticoagulation Instructions: INR 2.4 Continue coumadin 7.5mg  once daily except 5mg  on Mondays and Fridays

## 2010-04-25 ENCOUNTER — Encounter: Payer: Self-pay | Admitting: Cardiology

## 2010-04-25 ENCOUNTER — Encounter (INDEPENDENT_AMBULATORY_CARE_PROVIDER_SITE_OTHER): Payer: 59

## 2010-04-25 DIAGNOSIS — I4891 Unspecified atrial fibrillation: Secondary | ICD-10-CM

## 2010-04-25 DIAGNOSIS — Z7901 Long term (current) use of anticoagulants: Secondary | ICD-10-CM

## 2010-05-03 ENCOUNTER — Encounter: Payer: Self-pay | Admitting: Cardiology

## 2010-05-04 NOTE — Medication Information (Signed)
Summary: ccr-lr  Anticoagulant Therapy  Managed by: Vashti Hey, RN Referring MD: Diona Browner PCP: Dr. Wyvonnia Lora Supervising MD: Andee Lineman MD, Michelle Piper Indication 1: Atrial Fibrillation (ICD-427.31) Lab [facility]: [facility] [place] Site: [place] INR POC 2.4  Dietary changes: no    Health status changes: no    Bleeding/hemorrhagic complications: no    Recent/future hospitalizations: no    Any changes in medication regimen? no    Recent/future dental: no  Any missed doses?: no       Is patient compliant with meds? yes       Allergies: No Known Drug Allergies  Anticoagulation Management History:      The patient is taking warfarin and comes in today for a routine follow up visit.  Negative risk factors for bleeding include an age less than 65 years old.  The bleeding index is 'low risk'.  Positive CHADS2 values include History of HTN.  Negative CHADS2 values include Age > 65 years old.  The start date was 04/01/2007.  Anticoagulation responsible provider: Andee Lineman MD, Michelle Piper.  INR POC: 2.4.  Cuvette Lot#: [id number].  Exp: 10/11.    Anticoagulation Management Assessment/Plan:      The patient's current anticoagulation dose is Warfarin sodium 2.5 mg tabs: Use as directed by Anticoagualtion Clinic.  The target INR is 2 - 3.  The next INR is due 05/26/2010.  Anticoagulation instructions were given to patient.  Results were reviewed/authorized by Vashti Hey, RN.  He was notified by Vashti Hey RN.         Prior Anticoagulation Instructions: INR 2.4 Continue coumadin 7.5mg  once daily except 5mg  on Mondays and Fridays  Current Anticoagulation Instructions: Same as Prior Instructions.

## 2010-05-15 ENCOUNTER — Ambulatory Visit (INDEPENDENT_AMBULATORY_CARE_PROVIDER_SITE_OTHER): Payer: 59 | Admitting: Cardiology

## 2010-05-15 ENCOUNTER — Encounter: Payer: Self-pay | Admitting: Cardiology

## 2010-05-15 DIAGNOSIS — I4891 Unspecified atrial fibrillation: Secondary | ICD-10-CM

## 2010-05-15 DIAGNOSIS — I10 Essential (primary) hypertension: Secondary | ICD-10-CM

## 2010-05-16 NOTE — Letter (Signed)
Summary: External Correspondence/  OFFICE NOTE DR. HRANITZKY  External Correspondence/  OFFICE NOTE DR. HRANITZKY   Imported By: Dorise Hiss 05/10/2010 09:00:51  _____________________________________________________________________  External Attachment:    Type:   Image     Comment:   External Document

## 2010-05-20 ENCOUNTER — Other Ambulatory Visit: Payer: Self-pay | Admitting: *Deleted

## 2010-05-20 ENCOUNTER — Encounter: Payer: Self-pay | Admitting: Cardiology

## 2010-05-20 DIAGNOSIS — I4891 Unspecified atrial fibrillation: Secondary | ICD-10-CM

## 2010-05-20 DIAGNOSIS — Z7901 Long term (current) use of anticoagulants: Secondary | ICD-10-CM

## 2010-05-25 NOTE — Assessment & Plan Note (Signed)
Summary: 6 MO F/U FH      Allergies Added: ! * DUST MITES ! * TREES ! * GRASS  Visit Type:  Follow-up Referring Provider:  Dr. Bedelia Person Primary Provider:  Dr. Wyvonnia Lora   History of Present Illness: 65 year old male presents for followup. He was seen in October 2011. More recently he had followup with Dr. Deno Lunger at [facility]. There is some discussion about possibly reducing standing dose of Flecainide, presuming followup monitoring did not show significant breakthrough atrial fibrillation.  Followup labs from 7 March showed potassium 3.8, BUN 14, creatinine 0.8, AST 29, ALT 26.  Submaximal exercise treadmill testing on medical therapy is described below, done following our last visit for ischemic surveillance. No inducible arrhythmias or diagnostic ST segment changes at submaximal heart rate response were noted.  From a symptom perspective he does have occasional palpitations, occasionally brief chest pain, also reports some spikes in his blood pressure. He is not reporting any reproducible exertional symptoms however, and indicates his blood pressure has been reasonably well controlled over the last few weeks.  Today we talked about following blood pressure, and also warning signs and symptoms that might prompt further evaluation. He is comfortable with observation at this time.  Preventive Screening-Counseling & Management  Alcohol-Tobacco     Smoking Status: quit     Year Quit: 2003  Comments: never smoked cigarettes/only smoked cigars 1-2 per month  Current Medications (verified): 1)  Zyrtec Allergy 10 Mg Tabs (Cetirizine Hcl) .... Take 1 Tablet By Mouth Once A Day 2)  Verapamil Hcl Cr 180 Mg Cr-Tabs (Verapamil Hcl) .... Take 1 Tab By Mouth At Bedtime 3)  Losartan Potassium 100 Mg Tabs (Losartan Potassium) .... Take 1 Tablet By Mouth Once A Day 4)  Metformin Hcl 500 Mg Tabs (Metformin Hcl) .... Take 1 Tablet By Mouth Once A Day 5)  Pravastatin Sodium 40 Mg Tabs  (Pravastatin Sodium) .... Take One Tablet By Mouth Daily At Bedtime 6)  Hydroxyurea 500 Mg Caps (Hydroxyurea) .... Take 1 Tablet By Mouth Three Times A Week 7)  Warfarin Sodium 2.5 Mg Tabs (Warfarin Sodium) .... Use As Directed By Anticoagualtion Clinic 8)  Allergy Shots .... Weekly 9)  Calcium + D 600-200 Mg-Unit Tabs (Calcium Carbonate-Vitamin D) .... Take 1 Tablet By Mouth Two Times A Day 10)  Fish Oil 1000 Mg Caps (Omega-3 Fatty Acids) .... Take 1 Tablet By Mouth Two Times A Day 11)  Optivar 0.05 % Soln (Azelastine Hcl) .... As Needed 12)  Astelin 137 Mcg/spray Soln (Azelastine Hcl) .... As Needed 13)  Tylenol 325 Mg Tabs (Acetaminophen) .... As Needed 14)  Flecainide Acetate 100 Mg Tabs (Flecainide Acetate) .... Take 1 Tablet By Mouth Two Times A Day  Allergies (verified): 1)  ! * Dust Mites 2)  ! * Trees 3)  ! * Grass  Comments:  Nurse/Medical Assistant: The patient's medication list and allergies were reviewed with the patient and were updated in the Medication and Allergy Lists.  Past History:  Past Medical History: Last updated: 05/27/2009 Atrial Fibrillation - ablation at [facility] 2009, Dr. Deno Lunger Diabetes Type 2 Hypertension ITP 2001 Essential erythrocytosis  Social History: Last updated: 05/27/2009 Retired  Married  Tobacco Use - Former Alcohol Use - no Regular Exercise - no Drug Use - no  Review of Systems       The patient complains of weight gain.  The patient denies anorexia, fever, dyspnea on exertion, peripheral edema, prolonged cough, headaches, melena, and hematochezia.  Otherwise reviewed and negative except as outlined.  Vital Signs:  Patient profile:   65 year old male Height:      73 inches Weight:      225 pounds BMI:     29.79 Pulse rate:   78 / minute BP sitting:   125 / 86  (left arm) Cuff size:   large  Vitals Entered By: Carlye Grippe (May 15, 2010 3:49 PM)  Nutrition Counseling: Patient's BMI is greater than 25 and  therefore counseled on weight management options.  Physical Exam  Additional Exam:  Well-nourished male in no acute distress. HEENT: Conjunctiva and lids normal, oropharynx clear. Neck: Supple, no carotid bruits, no elevated JVP. Lungs: Clear to auscultation, nonlabored. Cardiac: Regular rate and rhythm, no S3. Abdomen: Soft, nontender. Skin: Warm and dry. Extremities: No pitting edema. Distal pulses are full. Musculoskeletal: No lower extremity pitting edema. Neuropsychiatric: Alert and oriented x3, affect appropriate.   Exercise Stress Test  Procedure date:  12/12/2009  Findings:      Nondiagnostic at maximum heart rate 113 beats per minute, 72% MPHR on regular medications. No diagnostic ST segment changes, no arrhythmias. Sinus rhythm noted throughout.  EKG  Procedure date:  05/03/2010  Findings:      Sinus bradycardia at 57 beats per minute, prolonged PR interval, QTC 425 ms.  Impression & Recommendations:  Problem # 1:  ATRIAL FIBRILLATION (ICD-427.31)  Continue present regimen, modifying flecainide therapy as directed by Dr. Deno Lunger. No bleeding problems with Coumadin. Submaximal treadmill test on full medical therapy following last visit is outlined above. We discussed warning signs, also follow up of blood pressure. Routine visit in 6 months, sooner if needed.  His updated medication list for this problem includes:    Warfarin Sodium 2.5 Mg Tabs (Warfarin sodium) ..... Use as directed by anticoagualtion clinic    Flecainide Acetate 100 Mg Tabs (Flecainide acetate) .Marland Kitchen... Take 1 tablet by mouth two times a day  Problem # 2:  ESSENTIAL HYPERTENSION, BENIGN (ICD-401.1)  Blood pressure is reasonably well controlled today. I asked him to keep an eye on this with Dr. Margo Common.  His updated medication list for this problem includes:    Verapamil Hcl Cr 180 Mg Cr-tabs (Verapamil hcl) .Marland Kitchen... Take 1 tab by mouth at bedtime    Losartan Potassium 100 Mg Tabs (Losartan  potassium) .Marland Kitchen... Take 1 tablet by mouth once a day  Patient Instructions: 1)  Your physician recommends that you continue on your current medications as directed. Please refer to the Current Medication list given to you today. 2)  Follow up in  6 months

## 2010-05-26 ENCOUNTER — Ambulatory Visit (INDEPENDENT_AMBULATORY_CARE_PROVIDER_SITE_OTHER): Payer: 59 | Admitting: *Deleted

## 2010-05-26 DIAGNOSIS — I4891 Unspecified atrial fibrillation: Secondary | ICD-10-CM

## 2010-05-26 DIAGNOSIS — Z7901 Long term (current) use of anticoagulants: Secondary | ICD-10-CM

## 2010-06-26 ENCOUNTER — Other Ambulatory Visit: Payer: Self-pay | Admitting: Cardiology

## 2010-06-27 ENCOUNTER — Ambulatory Visit (INDEPENDENT_AMBULATORY_CARE_PROVIDER_SITE_OTHER): Payer: 59 | Admitting: *Deleted

## 2010-06-27 DIAGNOSIS — I4891 Unspecified atrial fibrillation: Secondary | ICD-10-CM

## 2010-06-27 DIAGNOSIS — Z7901 Long term (current) use of anticoagulants: Secondary | ICD-10-CM

## 2010-06-27 NOTE — Telephone Encounter (Signed)
[place] pt. 

## 2010-07-11 NOTE — Assessment & Plan Note (Signed)
[facility]                          [facility]   NAME:[name], Richard                  MRN:          [id number]  DATE:03/18/2008                            DOB:          04-01-1945    PRIMARY CARE PHYSICIAN:  Wyvonnia Lora, MD   REASON FOR VISIT:  Followup atrial fibrillation.   HISTORY OF PRESENT ILLNESS:  Mr. Busk returns for a followup  visit with prior history of atrial fibrillation ablation at [facility].  He continues to do very well without any significant  palpitations and reports that he wore an outpatient monitor around the  time of discontinuing flecainide per Dr. Deno Lunger.  He is continuing on  Coumadin with an anticipated stop date in June, assuming he has no other  significant breakthrough arrhythmias.  He is overall very pleased with  his progress and has no other exertional symptoms.  He is not having any  bleeding problems on Coumadin and is being followed through our Coumadin  Clinic.  Recent INR was 2.5.   ALLERGIES:  No known drug allergies.   PRESENT MEDICATIONS:  1. Verapamil ER 180 mg p.o. nightly.  2. Avapro 300 mg p.o. daily.  3. Metformin XR 500 mg p.o. daily.  4. Pravastatin 40 mg p.o. nightly.  5. Hydroxyurea 500 mg p.o. three times a week.  6. Coumadin as directed by the Coumadin Clinic to achieve a      therapeutic INR of 2.0-3.0.  7. Weekly allergy shots.  8. Zyrtec 10 mg p.o. nightly.  9. Optivar 1 drop to both eyes as needed.  10.Astelin nasal spray as needed.  11.Calcium with vitamin D 600 mg 2 tablets p.o. daily.  12.Omega-3 supplements 1000 mg 2 tablets p.o. daily.   REVIEW OF SYSTEMS:  As outlined above.  Otherwise negative.   PHYSICAL EXAMINATION:  VITAL SIGNS:  Blood pressure is 129/86, heart  rate is 63 and regular, and weight is 225 pounds.  GENERAL:  The patient is comfortable and in no acute distress.  NECK:  No elevated jugular venous pressure.  No loud bruits.   No  thyromegaly.  LUNGS:  Clear without labored breathing.  CARDIAC:  Regular rate and rhythm.  No S3 gallop or significant systolic  murmur.  No pericardial rub.  EXTREMITIES:  No significant pitting edema.  Distal pulses are 2+.   IMPRESSION AND RECOMMENDATIONS:  History of atrial fibrillation, status  post atrial fibrillation ablation at [facility] by Dr. Deno Lunger.  The patient is doing very well.  Previous right thigh muscular  hemorrhage/hematoma has resolved and the patient continues on Coumadin  with plans to discontinue likely around June, assuming his arrhythmia  remains quiescent.  He is now off flecainide.  I will plan to see him  back over the next 6 months.     Jonelle Sidle, MD  Electronically Signed    SGM/MedQ  DD: 03/18/2008  DT: 03/18/2008  Job #: [id number]   cc:   Trellis Paganini, MD

## 2010-07-11 NOTE — Assessment & Plan Note (Signed)
[facility]                          [facility]   NAME:[name], [name]                  MRN:          [id number]  DATE:01/02/2007                            DOB:          1945-12-05    CARDIOLOGY CONSULTATION   REASON FOR CONSULTATION:  Recurrent atrial fibrillation.   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  Blood work in September showed a hemoglobin and  hematocrit of 16 and 46 respectively and his platelet count at that time  was 223,000.  He also has a history of paroxysmal atrial fibrillation  that was originally diagnosed in 2005.  He was on Coumadin for  approximately 10 months at that time and otherwise treated with rate  control medications, ultimately, with resolution of any symptoms related  to rapid ventricular response and discontinuation of Coumadin.  He  reports that since that time he has had some intermittent palpitations  but nothing progressive, although, recently while traveling up in  [place] and visiting his son, he developed a recurrent episode of  fatigue associated with irregular heart rate.  The patient was checked  by one of his son's neighbors, who is a Advice worker, who felt  that the patient was in atrial fibrillation, although with controlled  heart rate in the 60's.  It was asked that he start taking an aspirin  and then follow up when he returned to town.  He saw Dr. Margo Pham on  December 09, 2006 and was referred to see [place] today.   The patient is in sinus rhythm today with his electrocardiogram showing  sinus bradycardia with a prolonged PR interval of 210 milliseconds and a  heart rate of 56 beats per minute.   Symptomatically, he has not had any  recent palpitations.  He does describe exertional dyspnea, particularly  when he walks up hills.  He is having no frank exertional chest pain.  He has had no prior ischemic evaluation.  He did have an echocardiogram  done back in 2005 demonstrating an ejection fraction of 55 to 60% with  mild mitral regurgitation and mild tricuspid regurgitation but no  evidence of pulmonary hypertension.  Recently, Dr. Margo Pham cut the  patient's metoprolol back from 50 mg to 25 mg daily and he otherwise  continues on the medications outlined below.   ALLERGIES:  No known drug allergies.   CURRENT MEDICATIONS:  1. Verapamil ER 240 mg p.o. daily.  2. Avapro 300 mg p.o. daily.  3. Metoprolol ER 25 mg p.o. daily.  4. Metformin XR 500 mg p.o. daily.  5. Hydroxyurea 500 mg three times a week.  6. Allergy shots.  7. Xyzal 5 mg p.o. daily.  8. Optivar eye drops.  9. Astelin nasal spray.  10.Calcium with vitamin D 600 mg p.o. b.i.d.  11.Spectro-Vite daily.  12.Aspirin 81 mg p.o. daily.   PAST MEDICAL HISTORY:  His past medical  history is as outlined above.  The patient also has a history of migraine headaches for which he was  originally taking beta blocker therapy, shoulder problems with prior  rotator cuff surgery, tonsillectomy and herniorrhaphy.  No obvious  history of cardiovascular disease based on available information.   SOCIAL HISTORY:  The patient is married.  He has three children.  He  denies any active cigarette use.  He does smoke a cigar occasionally.  No alcohol use.   FAMILY HISTORY:  His family history was reviewed and is significant for  cardiovascular disease including his father, who died at age 32.   PHYSICAL EXAMINATION:  VITAL SIGNS:  Blood pressure is 129/94, heart  rate is 57, weight is 228 pounds.  GENERAL APPEARANCE:  This is an overweight male in no acute distress.  HEENT:  Conjunctivae, lids normal. Oropharynx is clear.  NECK:   Supple.  No elevated jugular venous pressure.  No loud bruits.  No thyromegaly is noted.  LUNGS:  Clear, nonlabored breathing at rest.  CARDIAC:  Exam reveals a regular rate and rhythm, soft systolic murmur,  preserved second heart sound, no S3, gallop or pericardial rub.  ABDOMEN:  Soft, no bruits, nontender.  EXTREMITIES:  Exhibit no significant pitting edema.  Distal pulses are  2+.  SKIN:  Warm and dry.  MUSCULOSKELETAL:  No kyphosis noted.  NEUROPSYCHIATRIC:  The patient is alert and oriented x3.  Affect is  normal.   IMPRESSION AND RECOMMENDATIONS:  1. Paroxysmal atrial fibrillation, presently in normal sinus rhythm.      Given relative bradycardia at rest, I agree with the decrease in      beta blocker dose from 50 to 25 mg daily.  The question remains as      to whether this will be adequate rate control when he is in atrial      fibrillation, although he certainly did not manifest a rapid      ventricular response recently.  Symptomatically, he has dyspnea on      exertion which may well be multifactorial, although with cardiac      risk factors and family history, I think a stress test would be in      order.  Will plan to schedule an adenosine Cardiolite on medical      therapy and can follow up from there.  Generally, [name]      CHADS score is around 2 based on his hypertension and diabetes      mellitus.  I spoke with [name] who did not feel that the history      of erythrocytosis was significant enough to contribute to any      hyperviscosity syndrome.  We will have to discuss the merits of      continuing aspirin versus long-term Coumadin.  I will plan to have      him      come back to see me over the next few weeks.  For now, he will stay      on an aspirin daily.  2. Further plans to follow.     Jonelle Sidle, MD  Electronically Signed    SGM/MedQ  DD: 01/02/2007  DT: 01/02/2007  Job #: [id number]   cc:   [name],  M.D.

## 2010-07-11 NOTE — Assessment & Plan Note (Signed)
[facility]                          [facility]   NAME:[name], [name]                  MRN:          [id number]  DATE:04/15/2007                            DOB:          1945-12-23    PRIMARY CARE PHYSICIAN:  Wyvonnia Lora, MD   I reviewed some recent event monitor strips done through [facility].  I  last saw [name] back in December and he was more recently seen  by Dr. Andee Lineman as a walk-in to clinic with an episode of recurrent atrial  fibrillation in early February.  At that time, he was rate controlled  and the plan was to initiate Coumadin which was certainly reasonable as  well as place an event recorder to document persistence and frequency of  atrial fibrillation.  I reviewed a vast number of strips and these  indicate paroxysmal atrial fibrillation with rates increasing sometimes  to the 120s, although generally more in the 100 range.  More recently,  he had resting sinus bradycardia reportedly in the early morning hours  while asleep and presumably asymptomatic with heart rates dipping into  the 30s at times.  He had otherwise no significant pauses. Also  occasional aberrantly conducted complex versus premature ventricular  contractions.   His medical regimen includes verapamil SR 240 mg daily and metoprolol ER  25 mg daily for rate control as well as now Coumadin for stroke  prophylaxis.  I reviewed the strips and discussed the situation with Dr.  Andee Lineman.  We thought it would be reasonable to initiate an  antiarrhythmic, specifically flecainide at 50 mg p.o. q.12 h to try to  suppress his paroxysmal atrial fibrillation.  He has undergone prior  ischemic evaluation, which was reassuring.  At the same time metoprolol  ER would be discontinued to try will avoid episodic bradycardia.  I  discussed this plan with [name] and she will contact the patient to  initiate therapy.  [name] will need a followup  electrocardiogram this week on Thursday for review of intervals and  rhythm and also a standard exercise treadmill test and one weeks' time  to exclude proarrhythmia.  Fortunately, the patient is also continuing  to wear an event recorder during this time.  We will plan to see him  back over the next few weeks to see how he is doing.     Jonelle Sidle, MD  Electronically Signed    SGM/MedQ  DD: 04/15/2007  DT: 04/15/2007  Job #: [id number]   cc:   Wyvonnia Lora

## 2010-07-11 NOTE — Assessment & Plan Note (Signed)
[facility]                          [facility]   NAME:HARRENSTEINJalan, Fariss                  MRN:          [id number]  DATE:10/01/2008                            DOB:          November 01, 1945    PRIMARY CARE PHYSICIAN:  Dr. Wyvonnia Lora.   REASON FOR VISIT:  Scheduled followup.   HISTORY OF PRESENT ILLNESS:  Mr. Lowdermilk returns for a 19-month  visit, last seen in the office back in January.  I did see him recently  during a hospital observation with a more prolonged rapid episode of  breakthrough atrial fibrillation.  He did not require attempts at  cardioversion and spontaneously returned to sinus rhythm with rate  control.  That particular episode was more rapid than usual with heart  rates up into the 140s, but responded well to intravenous diltiazem.  His cardiac markers were normal and potassium was mildly low at 3.4,  subsequently repleted.  INR was 1.8 on Coumadin.  Given his history of  episodic breakthrough atrial fibrillation following radiofrequency  ablation in the past, we discussed using a pill in the pocket approach  with flecainide, particularly as he had tolerated that medicine as a  standing dose in the past.  He was very comfortable with this plan.   He returns to the office today with his wife.  He states he feels fine  without any palpitations since discharge.  He just recently got back  from an overnight fishing trip.  His electrocardiogram today shows sinus  rhythm with a corrected QT interval of 442 msec and switched limb leads,  otherwise no significant change compared to tracing from April of this  year.  He is due for followup PT/INR today.   ALLERGIES:  No known drug allergies.   MEDICATIONS:  1. Zyrtec 10 mg p.o. daily.  2. Verapamil SR 180 mg p.o. daily.  3. Avapro 300 mg p.o. daily.  4. Metformin XR 500 mg p.o. daily.  5. Pravastatin 40 mg p.o. nightly.  6. Hydroxyurea 500 mg p.o. 3 times a week.  7. Coumadin as direct by the Coumadin Clinic to achieve a therapy of      INR of 2.0 and 3.0.  8. Calcium and vitamin D 600 mg p.o. b.i.d.  9. Weekly allergy shots.  10.Omega-3 fish oil supplements 1000 mg p.o. b.i.d.  11.Flecainide to be taken 150 mg p.r.n. breakthrough atrial      fibrillation, no more than 2 doses in 24 hours.   REVIEW OF SYSTEMS:  As outlined above.  He has had no bleeding problems.  Otherwise reviewed and are negative.   PHYSICAL EXAMINATION:  VITAL SIGNS:  Blood pressure 116/65, heart rate  is 65 and regular, and weight is 223 pounds.  GENERAL:  This is an overweight male in no acute distress.  NECK:  No elevated jugular venous pressure.  No loud bruits.  No  thyromegaly.  LUNGS:  Clear without labored breathing at rest.  CARDIAC:  Regular rate and rhythm.  No loud murmur or gallop.  EXTREMITIES:  Exhibit no pitting edema.   IMPRESSION/RECOMMENDATIONS:  Paroxysmal atrial fibrillation, status  post  radiofrequency ablation by Dr. Deno Lunger at [facility].  He  continues on Coumadin and we are now employing a pill in the pocket  approach using flecainide for breakthrough atrial fibrillation.  He is  symptomatically stable at this point.  We will plan a followup PT/INR  and I will see him back over the next 6 months, sooner if he manifests  progressive symptoms.  He states that he is due to be seen at [facility]  sometime in December of this year.     Jonelle Sidle, MD  Electronically Signed    SGM/MedQ  DD: 10/01/2008  DT: 10/01/2008  Job #: [id number]   cc:   Wyvonnia Lora  Dr. Bedelia Person, [facility]

## 2010-07-11 NOTE — Assessment & Plan Note (Signed)
[facility]                          [facility]   NAME:HARRENSTEINTorren, Maffeo                  MRN:          [id number]  DATE:07/29/2007                            DOB:          10-14-1945    PRIMARY CARE PHYSICIAN:  Dr. Wyvonnia Lora.   REASON FOR VISIT:  Followup of atrial fibrillation.   HISTORY OF PRESENT ILLNESS:  Mr. Careaga comes back in today to the  clinic with his wife.  He brings in a record of his blood pressure and  heart rate and mentions that he continues to have fairly frequent break-  through palpitations and symptomatic atrial fibrillation, based on his  previous events.  Today in the clinic he is in sinus rhythm, as  evidenced by his follow-up electrocardiogram.  His corrected QT interval  is 440 msec which is stable.  He continues on a reasonable dose of  Flecainide which we actually increased in the past and also Coumadin  with no bleeding problems.  He gets short of breath and has general  discomfort with his atrial fibrillation.  We talked about other options,  including a change in anti-arrhythmic therapy, versus referral for  discussions regarding atrial fibrillation ablation.  At this point, we  plan to make a referral to [facility], to discuss the latter.   ALLERGIES:  No known drug allergies.   PRESENT MEDICATIONS:  1. Flecainide 100 mg p.o. twice daily.  2. Omega-3, 1000 mg p.o. twice daily.  3. Verapamil ER 180 mg p.o. daily.  4. Coumadin as directed by the Coumadin Clinic.  5. Pravastatin 40 mg p.o. daily.  6. Spectravite daily.  7. Calcium with vitamin D 600 mg p.o. twice daily.  8. Xyzal 5 mg p.o. daily.  9. Hydroxyurea 500 mg p.o. three times daily.  10.Metformin XR 500 mg p.o. daily.  11.Avapro 300 mg p.o. daily.   REVIEW OF SYSTEMS:  As found in the history of present illness.  No  dizziness or syncope.  Otherwise negative.   PHYSICAL EXAMINATION:  VITAL SIGNS:  Blood pressure 117/78, heart  rate  63 and regular, weight 230 pounds.  GENERAL:  The patient is comfortable, in no acute distress.  NECK:  No elevated jugular venous pressure.  No bruits.  LUNGS:  Clear.  HEART:  A regular rate and rhythm.  No rub, murmur or gallop.  EXTREMITIES:  No pitting edema.   IMPRESSION/RECOMMENDATIONS:  Paroxysmal atrial fibrillation with  continued breakthroughs, fairly frequent, on Flecainide and Coumadin.  He has had prior problems with bradycardia when on additional Toprol XL  and this has been discontinued.  He is due for a follow-up INR today.  We discussed the matter and our plan will be a referral to the [facility]  Electrophysiology Department, to discuss potential for an atrial  fibrillation ablation.  Alternatively a change to a different  antiarrhythmic such as Tikosyn could be considered.     Jonelle Sidle, MD  Electronically Signed    SGM/MedQ  DD: 07/29/2007  DT: 07/29/2007  Job #: [id number]   cc:   Wyvonnia Lora

## 2010-07-11 NOTE — Assessment & Plan Note (Signed)
[facility]                          [facility]   NAME:[name], [name]                  MRN:          [id number]  DATE:04/22/2007                            DOB:          [birth year]    PRIMARY CARE PHYSICIAN:  Dr. Wyvonnia Lora   REASON FOR VISIT:  Follow up atrial fibrillation.   HISTORY OF PRESENT ILLNESS:  Since my prior dictation Mr. Pettengill is  now on Flecainide 50 mg p.o. b.i.d..  He has had some breakthrough  atrial fibrillation although less prolonged and is in fact in normal  sinus rhythm today, heart rate is 64 beats per minute.  PR interval of  216 milliseconds and his QT interval is normal.  He has a follow-up  exercise treadmill test tomorrow to exclude proarrhythmia.  He remains  fairly symptomatic when he goes into atrial fibrillation, complaining of  dyspnea and fatigue but no chest pain.  Today we talked about trying to  continue medical therapy with antiarrhythmics, and if this fails  potentially consider referral for discussion of an atrial fibrillation  ablation.   ALLERGIES:  NO KNOWN DRUG ALLERGIES.   MEDICATIONS:  1. Verapamil ER 240 mg p.o. daily  2. Avapro 300 mg p.o. daily  3. Metformin XR 500 mg p.o. daily  4. Hydroxyurea 500 mg p.o. t.i.d.  5. Allergy shots weekly  6. Xyzal 5 mg p.o. daily  7. Calcium with vitamin D 600 mg p.o. b.i.d.  8. Spectro-Vite daily  9. Pravastatin 40 mg p.o. daily  10.Flecainide 50 mg p.o. b.i.d.  11.Coumadin as directed by the Coumadin clinic.   REVIEW OF SYSTEMS:  As in history of present illness, no bleeding  problems.   EXAMINATION:  Blood pressure is 125/83, heart rate is 78 and regular.  Weight is 233 pounds.  The patient is comfortable and in no acute  distress.  NECK reveals no elevated jugular venous pressure, no loud bruits.  LUNGS are clear without labored breathing.  CARDIAC:  Exam reveals a regular rate and rhythm.  No loud murmur or  gallop.  EXTREMITIES:  Show no significant pitting edema.   IMPRESSION/RECOMMENDATIONS:  Paroxysmal atrial fibrillation, symptomatic, and now on flecainide 50 mg  p.o. b.i.d. along with Coumadin and verapamil.  The patient is to follow  up for an exercise treadmill test tomorrow to exclude proarrhythmia. I  would also like a flecainide trough level.  He may need to have his  dose advanced further.  I will have him come back to the office over the  next month and discuss the situation further.  He is due for a followup  PT/INR today.     Jonelle Sidle, MD  Electronically Signed    SGM/MedQ  DD: 04/22/2007  DT: 04/22/2007  Job #: [id number]   cc:   Wyvonnia Lora

## 2010-07-11 NOTE — Assessment & Plan Note (Signed)
[facility]                          [facility]   NAME:HARRENSTEINBrallan, [name]                  MRN:          [id number]  DATE:05/22/2007                            DOB:          18-Aug-1945    PRIMARY CARE PHYSICIAN:  Wyvonnia Lora, MD.   REASON FOR VISIT:  Follow up atrial fibrillation.   HISTORY OF PRESENT ILLNESS:  Mr. Pullin is overall doing fairly  well with left atrial fibrillation on flecainide.  His follow-up  treadmill test showed no proarrhythmia and his flecainide trough level  was in the low range of normal at 0.22 (0.20-1.0).  His  electrocardiogram today shows sinus rhythm with stable intervals, no QT  prolongation, and PR interval of 222.  He has had no dizziness or  syncope.  He had an episode of irregular heart rate that was consistent  with atrial fibrillation back on the 16th and the 18th of this month but  otherwise he has been doing fairly well.  He is not reporting any chest  pain.  Today we talked about perhaps advancing his flecainide to 100 mg  twice daily and for now continuing verapamil at the present dose as he  has manifested no major bradycardia since being taken off of Toprol XL.   ALLERGIES:  No known drug allergies.   MEDICATIONS:  1. Verapamil ER 240 mg p.o. daily.  2. Avapro 300 mg p.o. daily.  3. Metformin XR 500 mg p.o. daily.  4. Hydroxyurea 500 mg three times a week.  5. Allergy shots.  6. Xyzal  5 mg p.o. daily.  7. Calcium 600 mg p.o. b.i.d.  8. Spectravite daily.  9. Pravastatin 40 mg p.o. daily.  10.Flecainide 100 mg p.o. b.i.d.  11.Coumadin as directed by the Coumadin clinic with INR 3.5 today.   REVIEW OF SYSTEMS:  As described in the present illness.  No bleeding.   EXAMINATION:  Blood pressure 131/87, heart rate is 64, weight is 231  pounds.  The patient is comfortable, in no acute distress.  HEENT:  Conjunctivae are normal.  Pharynx is clear.  NECK:  Supple.  No elevated  jugular venous pressure.  No loud bruits.  No thyromegaly was noted.  LUNGS:  Clear without labored breathing at rest.  CARDIAC:  A regular rate and rhythm.  No S3 gallop or pericardial rub.  ABDOMEN:  Soft, nontender, normoactive bowel sounds.  EXTREMITIES:  No significant pitting edema.  His distal this pulses are  2+.  SKIN:  Warm and dry.  MUSCULOSKELETAL:  No kyphosis noted.  NEUROPSYCHIATRIC:  Patient alert and oriented x3.  Affect is normal.   IMPRESSION/RECOMMENDATIONS:  Paroxysmal atrial fibrillation.  We will  plan to continue his present regimen with the exception of increasing  flecainide to 100 mg p.o. b.i.d. and decreasing verapamil ER to 180 mg  p.o. daily.  Coumadin will continue to be directed by the Coumadin  clinic.  I will have him follow up next week for an electrocardiogram to  reassess QT interval.  He will continue to keep an eye on his heart rate  and blood pressure at home.  Otherwise we will plan to see him back over  the next few months.     Jonelle Sidle, MD  Electronically Signed    SGM/MedQ  DD: 05/22/2007  DT: 05/23/2007  Job #: [id number]   cc:   Wyvonnia Lora

## 2010-07-11 NOTE — Assessment & Plan Note (Signed)
[facility]                          [facility]   NAME:HARRENSTEINLeilan, Bochenek                  MRN:          [id number]  DATE:04/01/2007                            DOB:          1945/09/08    PRIMARY CARE PHYSICIAN:  Wyvonnia Lora, MD.   HISTORY OF PRESENT ILLNESS:  The patient is a white male with a history  of paroxysmal atrial fibrillation as well as hypertension and type 2  diabetes mellitus.  Underwent prior ischemia evaluation which was  essentially within normal limits.  The patient is followed by Dr. Cleone Slim  for erythrocytosis and ITP.  (For the latter, he has required  splenectomy, treatment with steroids and IV IgG.)  The patient is  followed by Dr. Diona Browner, but the patient was a walk-in in the office  today because he felt he was back in atrial fibrillation.  His rate is  diet controlled, and his rate was approximately 69 beats minute. He is  hemodynamically stable. Denies any chest pain, shortness of breath,  orthopnea or PND.   MEDICATIONS:  1. Verapamil ER 240 mg p.o. daily.  2. Avapro 300 mg p.o. daily.  3. Metoprolol ER 25 mg p.o. daily.  4. Metformin XR 500 mg p.o. daily.  5. Hydroxyurea 500 mg 3 times a week.  6. Allergy shots.  7. Calcium.  8. Spectrovite.  9. Aspirin 81 mg a day.  10.Pravastatin daily.   PHYSICAL EXAMINATION:  VITAL SIGNS:  Blood pressure 132/98, heart rate  62, weight 223 pounds.  NECK:  Normal carotid strokes, no carotid bruits.  LUNGS:  Clear breath sounds bilaterally.  HEART:  Irregular rate and rhythm.  Normal S1, S2, no murmurs or  gallops.  ABDOMEN:  Soft.  EXTREMITIES:  No cyanosis, clubbing or edema.  NEUROLOGIC:  Patient alert, oriented and grossly nonfocal.   PROBLEM LIST:  1. Paroxysmal atrial fibrillation (recurrent).  2. Negative ischemia workup previously  3. Preserved left ventricular function.  4. Astrocytosis.  5. History of idiopathic thrombocytopenic purpura (status  post      splenectomy)   PLAN:  1. The patient is back in atrial fibrillation.  This may all be      paroxysmal, but I will start him on Coumadin in the event      cardioversion is required.  2. To given the patient's history of ITP and the initiation of      Coumadin, there is no indication for aspirin.  3. Cardiac Monitor will be applied to assess if the patient has PAF      versus persistent atrial fibrillation.  In the latter scenario,      consideration may need to be given to cardioversion and flecainide      therapy.     Learta Codding, MD,FACC  Electronically Signed    GED/MedQ  DD: 04/04/2007  DT: 04/06/2007  Job #: [id number]   cc:   Nita Sells, MD

## 2010-07-11 NOTE — Assessment & Plan Note (Signed)
[facility]                          [facility]ICE NOTE   [name], [name]                  MRN:          [id number]  DATE:12/15/2007                            DOB:          1945-10-17    CARDIOLOGIST:  Jonelle Sidle, MD   PRIMARY CARE PHYSICIAN:  Wyvonnia Lora, MD   REASON FOR VISIT:  A 1-week followup.   HISTORY OF PRESENT ILLNESS:  [name] is a 65 year old male  patient who recently underwent atrial fibrillation ablation at [facility].  He  saw Dr. Diona Browner back in followup on December 09, 2007, and he noted some  pain in his right groin.  He had evidence of ecchymosis and probable  localized hematoma.  Ultrasound of his right groin demonstrated an  inguinal hematoma, but no evidence of pseudoaneurysm or AV fistula.  He  had no evidence of DVT.  His hemoglobin was stable at 13.8.  His CT scan  demonstrated muscular hemorrhage involving the proximal right thigh and  medial compartment within the abductor musculature.  He was taken off  his Coumadin secondary to this.  The patient returns for followup today  and notes that he is feeling much better.  He does have a pulling  sensation in his right thigh from time to time.  He is having some  tracking of ecchymosis down his thigh as well.  He was having some low-  grade fevers, but these have resolved.  He denies any chest pain or  shortness of breath.  He has occasional brief fluttering sensation in  his chest, but no clear symptoms of recurrent paroxysmal atrial  fibrillation.   CURRENT MEDICATIONS:  Flecainide 100 mg b.i.d., Zyrtec 10 mg daily,  Avapro 300 mg daily, Metformin XR 500 mg daily,  Hydroxyurea 500 mg 3 times a week, Allergy shots q. week, Calcium plus  vitamin D, Pravastatin 40 mg daily,  Verapamil 180 mg daily, Fish oil 1 g b.i.d.   PHYSICAL EXAMINATION:  GENERAL:  He is a well-nourished and well-  developed male, in no distress.  VITAL SIGNS:  Blood  pressure is 108/67, pulse 71, and weight 329.2  pounds.  HEENT:  Normal.  NECK:  Without JVD.  CARDIAC:  Normal S1 and S2.  Regular rate and rhythm.  LUNGS:  Clear to auscultation bilaterally.  Soft and nontender.  EXTREMITIES:  Without edema.  Right groin with evidence of widespread  but resolving ecchymosis.  A very small hematoma noted, much improved.  No bruits auscultated.   Electrocardiogram reveals sinus rhythm, the heart rate is 64, no acute  changes, and first-degree AV block with a PR interval of 222  milliseconds.   ASSESSMENT AND PLAN:  Right thigh muscular hemorrhage/hematoma status  post atrial fibrillation ablation at [facility].  As noted above, he had no  evidence of arteriovenous fistula, pseudoaneurysm, or retroperitoneal  hemorrhage.  He is doing much better from a symptomatic standpoint.  He  is to remain off Coumadin for the time being.  Dr. Diona Browner also saw the  patient today.  We will see the patient back in 3 months in routine  followup.  He can return sooner as needed.  He will follow up with Dr.  Deno Lunger at [facility] as scheduled.      Tereso Newcomer, PA-C  Electronically Signed      Jonelle Sidle, MD  Electronically Signed   SW/MedQ  DD: 12/15/2007  DT: 12/16/2007  Job #: [id number]   cc:   Liliane Channel. Hranitzky, MD

## 2010-07-11 NOTE — Assessment & Plan Note (Signed)
[facility]                          [facility] NOTE   NAME:HARRENSTEINHuber, Mathers                  MRN:          [id number]  DATE:12/09/2007                            DOB:          [birth year]    PRIMARY CARE PHYSICIAN:  Wyvonnia Lora, MD.   ELECTROPHYSIOLOGIST:  Wendee Beavers. Hranitzky, MD at division of  cardiovascular medicine, [facility].   I saw Mr. Careaga earlier today.  Please see full note for details.  I referred him subsequently for right lower extremity Doppler studies as  well as a noncontrasted CT of the abdomen and pelvis given persistent  pain in his right leg following catheter-based atrial fibrillation  ablation approximately 2 weeks ago.  He had evidence of a hematoma and  ecchymosis in that region and was for a period of time on both Lovenox  and Coumadin awaiting return to therapeutic INR levels.  His right groin  ultrasound demonstrated an inguinal hematoma with no clear evidence of  pseudoaneurysm or AV fistula.  He had furthermore no evidence of deep  venous thrombosis.  He had a CBC showing a hemoglobin of 13.8 with  mildly increased white blood cell count of 11.2.  His CT scan  demonstrated muscular hemorrhage involving the proximal right thigh and  the medial compartment within the abductor musculature.  This was  described as being rather acute in appearance noted beginning at the  level of pubic symphysis on the right and continuing to the level of the  right upper thigh.  No interperitoneal or retroperitoneal hemorrhage was  noted.  Our office placed a call to Dr. Georgetta Haber office in light of  the recent atrial fibrillation ablation with plans for Coumadin.  Nursing discussed this with Iva Lento and it was ultimately the  recommendation that Coumadin could be discontinued at this point if  needed particularly in light of the present situation.  This was relayed  to the patient and we  will plan to see him back clinically in 1 week to  ensure that this process continues to improve rather than progress.     Jonelle Sidle, MD  Electronically Signed    SGM/MedQ  DD: 12/09/2007  DT: 12/10/2007  Job #: [id number]   cc:   Liliane Channel. Hranitzky, MD

## 2010-07-11 NOTE — Assessment & Plan Note (Signed)
[facility]                          [facility]   NAME:HARRENSTEINNadeem, Romanoski                  MRN:          [id number]  DATE:12/09/2007                            DOB:          Nov 24, 1945    REASON FOR VISIT:  Cardiac followup.   HISTORY OF PRESENT ILLNESS:  I last saw Mr. Persing back in June.  He had a history of symptomatic paroxysmal atrial fibrillation on  Coumadin (CHADS2 score of 2), flecainide, and verapamil.  I referred him  to the Electrophysiology Department at [facility] for discussion  regarding a change in antiarrhythmic therapy versus attempt at  radiofrequency catheter ablation.  I see that he ultimately was referred  for catheter ablation and was discharged from the hospital on the  November 28, 2007.  Records indicate that he tolerated the procedure well  and was continued on flecainide, verapamil, and Coumadin with a Lovenox  overlap.  He had a followup electrocardiogram done on December 02, 2007  which showed sinus rhythm with a leftward axis.  Otherwise, no  significant ST changes.  His INR today is therapeutic and was also  therapeutic at 2.4 back on December 05, 2007.   He reports generally that he has done well without any significant  palpitations or breathlessness.  His main complaint is of right groin  discomfort and ecchymosis with pain also somewhat in the right lower  back area and down his right leg.  He reports that initially he had no  symptoms when he got home, although over the few days with some  ambulation he began to notice the ecchymosis developing.  He states that  this seems to be improving somewhat although has not resolved over the  last 2 weeks.  His wife reports he has had a low grade fever reporting  temperatures approximately 100.5.  He has had no cough or chills.  He  has no calf pain bilaterally or frank abdominal pain.   ALLERGIES:  No known drug allergies.   MEDICATIONS:  1.  Flecainide 100 mg p.o. b.i.d.  2. Avapro 300 mg p.o. daily.  3. Metoprolol XR 500 mg p.o. daily.  4. Hydroxyurea to 500 mg 3 times weekly.  5. Allergy shots weekly.  6. Xyzal 5 mg p.o. daily.  7. Calcium with D 600 mg p.o. b.i.d.  8. Multivitamin daily.  9. Pravastatin 40 mg p.o. daily.  10.Coumadin as directed by the [facility].  11.Verapamil ER 180 mg p.o. daily.  12.Omega-3 supplements 1000 mg p.o. b.i.d.   REVIEW OF SYSTEMS:  As outlined above.  Otherwise negative.   PHYSICAL EXAMINATION:  VITAL SIGNS:  Blood pressure is stable 115/80,  heart rate is in the 70s and regular.  The patient was not weighed.  GENERAL:  He is in no acute distress.  HEENT:  Conjunctivae are normal.  Oropharynx is clear.  NECK:  Supple.  No elevated jugular venous pressure.  No loud bruits, no  thyromegaly is noted.  LUNGS:  Clear without labored at breathing at rest.  CARDIAC:  Reveals regular rate and rhythm.  No loud murmur pericardial  rub  or S3 gallop.  ABDOMEN:  Soft and nontender.  No ecchymosis noted.  No guarding.  Examination of the right groin site reveals area of firmness in the  inguinal area at site of prior vascular access.  This area is mildly  tender to palpation.  There is no obvious pulsatility and no bruit is  appreciated.  There is fairly extensive ecchymosis in the right thigh  area that looks to be resolving.  EXTREMITIES:  Distal pulses are 1-2+.  There is no cords palpable.  No  frank erythema and no purulent drainage.  The left groin site is well-  healed.  MUSCULOSKELETAL:  No kyphosis noted.  NEUROPSYCHIATRIC:  The patient is alert and oriented x3.  Affect seems  appropriate.   IMPRESSION AND RECOMMENDATIONS:  1. Status post recent catheter ablation of paroxysmal atrial      fibrillation at [facility].  The patient continues on      Coumadin, flecainide, and verapamil and has remained in sinus      rhythm recently.  His INR is therapeutic.  He has a followup  visit      with Dr. Deno Lunger at [facility] in December.  2. Right groin pain, ecchymosis and evidence of probable local      hematoma in the region of the vascular access site.  This developed      in the week after he got home while on a Lovenox bridge and one      wonders if he had some vascular oozing in that area contributing to      these findings.  He states that this is starting to improve      somewhat today, although has lingered over the last 2 weeks.  There      is no frank abdominal discomfort noted, although he has had some      mild lower back pain.  My concern would be to rule out a      retroperitoneal hematoma or other vascular complication.  We will      plan to obtain a CBC, a noncontrasted CT of the abdomen and pelvis      and also a vascular access site ultrasound.  I did not interrupt      his Coumadin as yet.  We will determine the next step based on his      testing.     Jonelle Sidle, MD  Electronically Signed    SGM/MedQ  DD: 12/09/2007  DT: 12/10/2007  Job #: [id number]   cc:   Trellis Paganini, MD

## 2010-07-11 NOTE — Assessment & Plan Note (Signed)
[facility]                          [facility]   NAME:[name], [name]                  MRN:          [id number]  DATE:01/27/2007                            DOB:          1945/06/02    PRIMARY CARE PHYSICIAN:  Dr. Wyvonnia Lora.   REASON FOR VISIT:  Followup cardiac testing.   HISTORY OF PRESENT ILLNESS:  I saw Mr. Persons back in early  November.  His history is detailed in the previous note.  I referred him  for basic ischemic evaluation given recurrent atrial fibrillation in the  setting of hypertension and Type 2 diabetes mellitus.  This study was  reassuring demonstrating no obvious perfusion or evidence of ischemia  with an ejection fraction of 59%.  I also spoke with Dr. Cleone Slim in the  interim and his feeling was that the patient did not necessarily  manifest any further thromboembolic risks based on his history of  erythrocytosis.  Since the prior visit, Mr. Robitaille states that he  had 1 episode of irregular heartbeat and a feeling of fatigue.  This  actually occurred this past Saturday.  His symptoms spontaneously  resolved some point overnight.  I presume that he had an episode of  atrial fibrillation at that time, although this was not documented.  Today, I spoke with the patient and his wife about Coumadin, and also  the option of continuing on aspirin given his borderline [place] score of  2.  At the present time, he seems to prefer aspirin over Coumadin and we  can continue to discuss this over time as the situation may change.  I  have otherwise asked him to be observant for frequency of recurrent  atrial fibrillation and we may need to consider antiarrhythmic therapy  if he is particularly bothered by this over time.  I also talked about  exercise and weight loss today.   ALLERGIES:  No known drug allergies.   PRESENT MEDICATIONS:  1. Verapamil ER 240 mg p.o. daily.  2. Avapro 300 mg p.o. daily.  3.  Metoprolol 25 mg p.o. daily.  4. Metformin XR 500 mg p.o. daily.  5. Hydroxyurea 500 mg 3 times a week.  6. Allergy shots.  7. Xyzal 5 mg p.o. daily.  8. Optivar eye drops.  9. Astelin nasal spray.  10.Calcium with vitamin D.  11.Spectro-Vite.  12.Aspirin 81 mg p.o. daily.   REVIEW OF SYSTEMS:  As described in the history of present illness.   EXAMINATION:  Blood pressure 112/ 75.  Heart rate is 76.  Weight 230  pounds.  The patient is comfortable and in no acute distress.  Otherwise, no  significant change in examination.   IMPRESSION/RECOMMENDATIONS:  1. Paroxysmal atrial fibrillation.  Our plan at this time is to      continue aspirin, verapamil ER, and Toprol XL.  If he manifests      recurrent symptoms of fatigue and irregular heart rate, I would      recommend an outpatient event recorder to document clearly whether      he is going in and out of atrial fibrillation.  If so, we may need      to entertain the option of antiarrhythmic therapy and perhaps even      more aggressive treatment with Coumadin.  I will plan a followup      over the next 6 months assuming he remains symptomatically stable.      Otherwise, we will see him back sooner.  He is to see Dr. Margo Common as      regularly scheduled over the next week.  2. Further plan is to follow.     Jonelle Sidle, MD  Electronically Signed    SGM/MedQ  DD: 01/27/2007  DT: 01/27/2007  Job #: [id number]   cc:   Wyvonnia Lora

## 2010-07-14 NOTE — Op Note (Signed)
[name], [name]                    ACCOUNT NO.:  [id number]   MEDICAL RECORD NO.:  [id number]                   PATIENT TYPE:  INP   LOCATION:  X006                                 FACILITY:  [facility]   PHYSICIAN:  Sharlet Salina T. Hoxworth, M.D.          DATE OF BIRTH:  1945/05/31   DATE OF PROCEDURE:  09/24/2001  DATE OF DISCHARGE:                                 OPERATIVE REPORT   PREOPERATIVE DIAGNOSIS:  Idiopathic thrombocytopenia purpura.   POSTOPERATIVE DIAGNOSIS:  Idiopathic thrombocytopenia purpura.   SURGICAL PROCEDURE:  Laparoscopic splenectomy.   SURGEON:  Lorne Skeens. Hoxworth, M.D.   ASSISTANT:  Anselm Pancoast. Zachery Dakins, M.D.   ANESTHESIA:  General.   HISTORY OF PRESENT ILLNESS:  The patient is a 65 year old white male who was  diagnosed with ITP in 2001.  He initially had an extensive workup by Dr.  Pierce Crane and Dr. Lynett Fish at that time and initially had an  excellent response to steroids.  He now has developed recurrent  thrombocytopenia, platelet count of 35,000, and after discussion of options  with Dr. Cleone Slim and myself, we have elected to proceed with laparoscopic  splenectomy.  The nature of the procedures, indications, risks of bleeding,  infection, failure to resolve his thrombocytopenia and pancreatic and  gastric injury were discussed and understood.  He is now brought to the  operating room for this procedure.   DESCRIPTION OF PROCEDURE:  The patient was brought to the operating room and  placed in a supine position on the operating table and general endotracheal  anesthesia was induced.  He was then carefully positioned with the bean bag  in the right lateral decubitus position.  Foley catheter had been placed.  Preoperative platelet count was 48,000.  PAS were in place.  He was given  preoperative antibiotics.  His entire abdomen and left flank were sterilely  prepped and draped.  Local anesthetic was used to infiltrate the trocar  sites.   Initially in the left subcostal area, the Veress needle was  inserted, pneumoperitoneum established and the Optiview 12-mm trocar placed.  Under direct vision, two further 12-mm trocars were placed near the  epigastrium, further lateral along the left costal margin.  The spleen was  visualized and was moderately enlarged.  There were some adhesions of the  omentum which were taken down from the lateral abdominal wall with harmonic  scalpel.  The splenic flexure of the colon was identified.  The splenic  flexure was mobilized away from the spleen and the splenocolic ligament  divided with a harmonic scalpel.  The left kidney was identified and  protected.  The inferior pole of the spleen was raised and was mobilized.  The splenorenal ligament was then mobilized posterolateral to the spleen  with a harmonic scalpel; this allowed further anterior mobilization of the  tip of the spleen and the vascular pedicle identified.  The stomach and  short gastric vessels were identified and beginning  at the angle of His,  using a harmonic scalpel, the stomach was mobilized away from the spleen,  dividing the short gastric vessels, and the lesser sac was entered, further  short gastrics divided down along the greater curve of the stomach and the  stomach completely mobilized away from the spleen and the lesser sac  exposed.  At this point, further inferior and posterior dissection was done  with a harmonic scalpel, dividing posterolateral attachments of the spleen  along the splenorenal ligament and up along the diaphragm and dissection  continued along the inferior aspect of the spleen.  Posteriorly, the  pancreas tail was identified near the splenic hilum, was dissected away and  the splenic vessels were identified.  The hilum was skeletonized inferiorly  and posteriorly and then superiorly, a window was created just above the  vessels and at this point, the spleen could be elevated up anteriorly and   the hilum was well-visualized and skeletonized down to just the major  vessels.  At this point, I was able to pass an Endo GIA 45-mm vascular  stapler around the hilum with complete visualization of the tail of the  pancreas and the vessels in the stapler and this was fired with complete  division of the hilar vessels with one firing and no bleeding from the  staple line.  Following this, final attachments along the diaphragm were  divided.  One of the 12-mm trocars was removed and a large bag placed  through this site and the spleen easily placed in the bag, the bag brought  up through the incision and the spleen was morcelized digitally and with a  ring clamp and the spleen was removed piecemeal through the bag.  It should  also be noted that prior to beginning any dissection, a search was made for  accessory spleen in the gastrosplenic ligament, the lesser omentum, greater  omentum and along the splenic flexure of the colon and no accessory spleen  was identified.  At this point, the left upper quadrant was thoroughly  irrigated and hemostasis assured.  Trocars were removed and each 12-mm site  was closed with a 0 Vicryl suture using an Endo Close device.  The patient  was given a platelet transfusion postoperatively.  Skin incisions were  closed with interrupted subcuticular 4-0 Monocryl and Steri-Strips.  Sponge  and needle counts were correct, dry sterile dressing were applied and the  patient was taken to recovery in good condition.                                               Lorne Skeens. Hoxworth, M.D.    Tory Emerald  D:  09/24/2001  T:  09/29/2001  Job:  [id number]   cc:   Lynett Fish, M.D.

## 2010-07-25 ENCOUNTER — Ambulatory Visit (INDEPENDENT_AMBULATORY_CARE_PROVIDER_SITE_OTHER): Payer: 59 | Admitting: *Deleted

## 2010-07-25 DIAGNOSIS — I4891 Unspecified atrial fibrillation: Secondary | ICD-10-CM

## 2010-07-25 DIAGNOSIS — Z7901 Long term (current) use of anticoagulants: Secondary | ICD-10-CM

## 2010-08-29 ENCOUNTER — Ambulatory Visit (INDEPENDENT_AMBULATORY_CARE_PROVIDER_SITE_OTHER): Payer: 59 | Admitting: *Deleted

## 2010-08-29 DIAGNOSIS — I4891 Unspecified atrial fibrillation: Secondary | ICD-10-CM

## 2010-08-29 DIAGNOSIS — Z7901 Long term (current) use of anticoagulants: Secondary | ICD-10-CM

## 2010-09-13 ENCOUNTER — Other Ambulatory Visit: Payer: Self-pay | Admitting: Cardiology

## 2010-09-13 NOTE — Telephone Encounter (Signed)
[facility] pt

## 2010-09-13 NOTE — Telephone Encounter (Signed)
**Note De-identified Marvelene Stoneberg Obfuscation** [facility] pt. 

## 2010-10-03 ENCOUNTER — Ambulatory Visit (INDEPENDENT_AMBULATORY_CARE_PROVIDER_SITE_OTHER): Payer: 59 | Admitting: *Deleted

## 2010-10-03 DIAGNOSIS — Z7901 Long term (current) use of anticoagulants: Secondary | ICD-10-CM

## 2010-10-03 DIAGNOSIS — I4891 Unspecified atrial fibrillation: Secondary | ICD-10-CM

## 2010-10-31 ENCOUNTER — Ambulatory Visit (INDEPENDENT_AMBULATORY_CARE_PROVIDER_SITE_OTHER): Payer: 59 | Admitting: *Deleted

## 2010-10-31 DIAGNOSIS — I4891 Unspecified atrial fibrillation: Secondary | ICD-10-CM

## 2010-10-31 DIAGNOSIS — Z7901 Long term (current) use of anticoagulants: Secondary | ICD-10-CM

## 2010-10-31 LAB — POCT INR: INR: 3.5

## 2010-11-28 ENCOUNTER — Ambulatory Visit (INDEPENDENT_AMBULATORY_CARE_PROVIDER_SITE_OTHER): Payer: 59 | Admitting: *Deleted

## 2010-11-28 DIAGNOSIS — I4891 Unspecified atrial fibrillation: Secondary | ICD-10-CM

## 2010-11-28 DIAGNOSIS — Z7901 Long term (current) use of anticoagulants: Secondary | ICD-10-CM

## 2010-11-28 LAB — POCT INR: INR: 3.2

## 2010-12-04 ENCOUNTER — Other Ambulatory Visit: Payer: Self-pay | Admitting: Cardiology

## 2010-12-15 ENCOUNTER — Encounter: Payer: Self-pay | Admitting: Cardiology

## 2010-12-22 ENCOUNTER — Ambulatory Visit: Payer: 59 | Admitting: Cardiology

## 2010-12-24 ENCOUNTER — Encounter: Payer: Self-pay | Admitting: Cardiology

## 2010-12-25 ENCOUNTER — Encounter: Payer: Self-pay | Admitting: Cardiology

## 2010-12-25 ENCOUNTER — Ambulatory Visit (INDEPENDENT_AMBULATORY_CARE_PROVIDER_SITE_OTHER): Payer: 59 | Admitting: *Deleted

## 2010-12-25 ENCOUNTER — Ambulatory Visit (INDEPENDENT_AMBULATORY_CARE_PROVIDER_SITE_OTHER): Payer: 59 | Admitting: Cardiology

## 2010-12-25 VITALS — BP 124/85 | HR 64 | Resp 18 | Ht 72.0 in | Wt 229.0 lb

## 2010-12-25 DIAGNOSIS — I4891 Unspecified atrial fibrillation: Secondary | ICD-10-CM

## 2010-12-25 DIAGNOSIS — Z7901 Long term (current) use of anticoagulants: Secondary | ICD-10-CM

## 2010-12-25 LAB — POCT INR: INR: 3.2

## 2010-12-25 NOTE — Assessment & Plan Note (Signed)
Symptomatically well controlled on present regimen. Followup ECG reviewed with normal QRS, mildly prolonged PR interval. No changes made today. Follow up with Dr. Deno Lunger in March.

## 2010-12-25 NOTE — Patient Instructions (Signed)
Your physician wants you to follow-up in: 6 months. You will receive a reminder letter in the mail one-two months in advance. If you don't receive a letter, please call our office to schedule the follow-up appointment. Your physician recommends that you continue on your current medications as directed. Please refer to the Current Medication list given to you today. 

## 2010-12-25 NOTE — Assessment & Plan Note (Signed)
Blood pressure well-controlled today. 

## 2010-12-25 NOTE — Progress Notes (Signed)
Clinical Summary [name] is a 65 y.o.male presenting for followup. He was seen back in March.  He reports doing very well, no significant palpitations. He recently got back from a seven-day trip to [place]. Reported no major functional limitation while walking around the various parks.  Followup ECG is reviewed below. QRS duration is normal, with mild PR prolongation.  No unusual bleeding problems on Coumadin.  He states that he is due to see Dr. Deno Lunger at [facility] sometime in March.   Allergies  Allergen Reactions  . Dust Mite Extract     Medication list reviewed.  Past Medical History  Diagnosis Date  . Atrial fibrillation     Ablation at [facility] 2009, Dr. Deno Lunger  . Diabetes mellitus type II   . Essential hypertension, benign   . ITP (idiopathic thrombocytopenic purpura) 2001  . Primary erythrocytosis     Past Surgical History  Procedure Date  . Rotator cuff repair   . Splenectomy, total 2001  . Tonsillectomy   . Hernia repair     Family History  Problem Relation Age of Onset  . Coronary artery disease Father     Social History [name] reports that he has quit smoking. His smoking use included Cigarettes. He has never used smokeless tobacco. [name] reports that he does not drink alcohol.  Review of Systems Negative except as outlined above.  Physical Examination Filed Vitals:   12/25/10 1054  BP: 124/85  Pulse: 64  Resp: 18   Well-nourished male in no acute distress.  HEENT: Conjunctiva and lids normal, oropharynx clear.  Neck: Supple, no carotid bruits, no elevated JVP.  Lungs: Clear to auscultation, nonlabored.  Cardiac: Regular rate and rhythm, no S3.  Abdomen: Soft, nontender.  Skin: Warm and dry.  Extremities: No pitting edema. Distal pulses are full.  Musculoskeletal: No lower extremity pitting edema.  Neuropsychiatric: Alert and oriented x3, affect appropriate.   ECG Sinus rhythm at 64 with PR interval 224 ms,  QRS 94 ms, leftward axis.   Problem List and Plan

## 2011-01-16 ENCOUNTER — Encounter: Payer: 59 | Admitting: *Deleted

## 2011-01-23 ENCOUNTER — Ambulatory Visit (INDEPENDENT_AMBULATORY_CARE_PROVIDER_SITE_OTHER): Payer: 59 | Admitting: *Deleted

## 2011-01-23 DIAGNOSIS — Z7901 Long term (current) use of anticoagulants: Secondary | ICD-10-CM

## 2011-01-23 DIAGNOSIS — I4891 Unspecified atrial fibrillation: Secondary | ICD-10-CM

## 2011-01-23 LAB — POCT INR: INR: 2.5

## 2011-02-01 ENCOUNTER — Other Ambulatory Visit: Payer: Self-pay | Admitting: Family Medicine

## 2011-02-06 ENCOUNTER — Other Ambulatory Visit: Payer: Self-pay | Admitting: Hematology and Oncology

## 2011-02-23 ENCOUNTER — Ambulatory Visit (INDEPENDENT_AMBULATORY_CARE_PROVIDER_SITE_OTHER): Payer: Medicare Other | Admitting: *Deleted

## 2011-02-23 DIAGNOSIS — Z7901 Long term (current) use of anticoagulants: Secondary | ICD-10-CM

## 2011-02-23 DIAGNOSIS — I4891 Unspecified atrial fibrillation: Secondary | ICD-10-CM

## 2011-02-23 LAB — POCT INR: INR: 4.4

## 2011-02-28 ENCOUNTER — Other Ambulatory Visit: Payer: Self-pay | Admitting: Cardiology

## 2011-02-28 NOTE — Telephone Encounter (Signed)
[place] pt. 

## 2011-03-13 ENCOUNTER — Ambulatory Visit (INDEPENDENT_AMBULATORY_CARE_PROVIDER_SITE_OTHER): Payer: Medicare Other | Admitting: *Deleted

## 2011-03-13 DIAGNOSIS — Z7901 Long term (current) use of anticoagulants: Secondary | ICD-10-CM

## 2011-03-13 DIAGNOSIS — I4891 Unspecified atrial fibrillation: Secondary | ICD-10-CM

## 2011-04-10 ENCOUNTER — Ambulatory Visit (INDEPENDENT_AMBULATORY_CARE_PROVIDER_SITE_OTHER): Payer: Medicare Other | Admitting: *Deleted

## 2011-04-10 DIAGNOSIS — I4891 Unspecified atrial fibrillation: Secondary | ICD-10-CM

## 2011-04-10 DIAGNOSIS — Z7901 Long term (current) use of anticoagulants: Secondary | ICD-10-CM

## 2011-05-08 ENCOUNTER — Ambulatory Visit (INDEPENDENT_AMBULATORY_CARE_PROVIDER_SITE_OTHER): Payer: Medicare Other | Admitting: *Deleted

## 2011-05-08 DIAGNOSIS — Z7901 Long term (current) use of anticoagulants: Secondary | ICD-10-CM

## 2011-05-08 DIAGNOSIS — I4891 Unspecified atrial fibrillation: Secondary | ICD-10-CM

## 2011-06-15 ENCOUNTER — Ambulatory Visit (INDEPENDENT_AMBULATORY_CARE_PROVIDER_SITE_OTHER): Payer: Medicare Other | Admitting: *Deleted

## 2011-06-15 DIAGNOSIS — I4891 Unspecified atrial fibrillation: Secondary | ICD-10-CM

## 2011-06-15 DIAGNOSIS — Z7901 Long term (current) use of anticoagulants: Secondary | ICD-10-CM

## 2011-06-19 ENCOUNTER — Ambulatory Visit: Payer: Medicare Other | Admitting: Cardiology

## 2011-06-19 ENCOUNTER — Other Ambulatory Visit: Payer: Self-pay | Admitting: Cardiology

## 2011-06-26 ENCOUNTER — Ambulatory Visit: Payer: Medicare Other | Admitting: Cardiology

## 2011-07-18 ENCOUNTER — Ambulatory Visit (INDEPENDENT_AMBULATORY_CARE_PROVIDER_SITE_OTHER): Payer: Medicare Other | Admitting: Cardiology

## 2011-07-18 ENCOUNTER — Encounter: Payer: Self-pay | Admitting: Cardiology

## 2011-07-18 VITALS — BP 126/85 | HR 64 | Resp 16 | Ht 72.0 in | Wt 225.0 lb

## 2011-07-18 DIAGNOSIS — I10 Essential (primary) hypertension: Secondary | ICD-10-CM

## 2011-07-18 DIAGNOSIS — I4891 Unspecified atrial fibrillation: Secondary | ICD-10-CM

## 2011-07-18 NOTE — Assessment & Plan Note (Signed)
Symptomatically controlled on present regimen. No change made today. Followup ECG is reviewed and stable. Will have six-month followup, CMET to be obtained at that time.

## 2011-07-18 NOTE — Assessment & Plan Note (Signed)
Blood pressure well controlled today. Keep followup with Dr. Margo Common.

## 2011-07-18 NOTE — Progress Notes (Signed)
Clinical Summary Mr. Kudrna is a 66 y.o.male presenting for followup. He was seen in October 2012. He continues to do relatively well with only occasional sense of brief palpitations. He has had no hospitalizations in the interim.  Lab work from December reviewed showing triglycerides 167, cholesterol 180, HDL 30, LDL 117, hemoglobin A1c 6.0, hemoglobin 16.1, platelets 223.  He reports compliance with medications, was seen at [facility] earlier in the year. He denies any significant bleeding problems on Coumadin. Followup ECG is reviewed below.   Allergies  Allergen Reactions  . Dust Mite Extract     Current Outpatient Prescriptions  Medication Sig Dispense Refill  . acetaminophen (TYLENOL) 325 MG tablet Take 650 mg by mouth as needed.        Marland Kitchen azelastine (ASTELIN) 137 MCG/SPRAY nasal spray Place 1 spray into the nose as needed. Use in each nostril as directed       . azelastine (OPTIVAR) 0.05 % ophthalmic solution 1 drop as needed.        . Calcium Carbonate-Vitamin D (CALCIUM + D) 600-200 MG-UNIT TABS Take 1 tablet by mouth 2 (two) times daily.        . cetirizine (ZYRTEC) 10 MG tablet Take 10 mg by mouth daily.        . fish oil-omega-3 fatty acids 1000 MG capsule Take 1 capsule by mouth 2 (two) times daily.        . flecainide (TAMBOCOR) 50 MG tablet Take 50 mg by mouth 2 (two) times daily.      . hydroxyurea (HYDREA) 500 MG capsule Take 500 mg by mouth 3 (three) times a week. May take with food to minimize GI side effects.       Marland Kitchen losartan (COZAAR) 100 MG tablet Take 100 mg by mouth daily.        . metFORMIN (GLUCOPHAGE) 500 MG tablet Take 500 mg by mouth daily.        . pravastatin (PRAVACHOL) 40 MG tablet Take 40 mg by mouth at bedtime.        . verapamil (CALAN-SR) 180 MG CR tablet Take 180 mg by mouth at bedtime.        Marland Kitchen warfarin (COUMADIN) 2.5 MG tablet TAKE AS DIRECTED  70 tablet  3    Past Medical History  Diagnosis Date  . Atrial fibrillation     Ablation at [facility]  2009, Dr. Deno Lunger  . Diabetes mellitus type II   . Essential hypertension, benign   . ITP (idiopathic thrombocytopenic purpura) 2001  . Primary erythrocytosis     Social History Mr. Bebout reports that he has quit smoking. His smoking use included Cigarettes. He has never used smokeless tobacco. Mr. Poythress reports that he does not drink alcohol.  Review of Systems Stable appetite, no exertional chest pain or dizziness. No orthopnea or PND. Reasonable energy level. Otherwise negative.  Physical Examination Filed Vitals:   07/18/11 0820  BP: 126/85  Pulse: 64  Resp: 16   Well-nourished male in no acute distress.  HEENT: Conjunctiva and lids normal, oropharynx clear.  Neck: Supple, no carotid bruits, no elevated JVP.  Lungs: Clear to auscultation, nonlabored.  Cardiac: Regular rate and rhythm, no S3.  Abdomen: Soft, nontender.  Skin: Warm and dry.  Extremities: No pitting edema. Distal pulses are full.    ECG Sinus bradycardia at 59 with PR interval 236 ms, leftward axis, normal QRS.  Problem List and Plan   ATRIAL FIBRILLATION Symptomatically controlled on present regimen. No  change made today. Followup ECG is reviewed and stable. Will have six-month followup, CMET to be obtained at that time.  ESSENTIAL HYPERTENSION, BENIGN Blood pressure well controlled today. Keep followup with Dr. Margo Common.     Jonelle Sidle, M.D., F.A.C.C.

## 2011-07-18 NOTE — Patient Instructions (Signed)
Your physician recommends that you schedule a follow-up appointment in:6 months. You will receive a reminder letter in the mail in about 4 months reminding you to call and schedule your appointment. If you don't receive this letter, please contact our office.  Your physician recommends that you return for lab work in: CMET LAB WORK just before your next visit. You will receive a reminder about this too.   Your physician recommends that you continue on your current medications as directed. Please refer to the Current Medication list given to you today.

## 2011-07-20 ENCOUNTER — Ambulatory Visit (INDEPENDENT_AMBULATORY_CARE_PROVIDER_SITE_OTHER): Payer: Medicare Other | Admitting: *Deleted

## 2011-07-20 DIAGNOSIS — Z7901 Long term (current) use of anticoagulants: Secondary | ICD-10-CM

## 2011-07-20 DIAGNOSIS — I4891 Unspecified atrial fibrillation: Secondary | ICD-10-CM

## 2011-08-31 ENCOUNTER — Ambulatory Visit (INDEPENDENT_AMBULATORY_CARE_PROVIDER_SITE_OTHER): Payer: Medicare Other | Admitting: *Deleted

## 2011-08-31 DIAGNOSIS — I4891 Unspecified atrial fibrillation: Secondary | ICD-10-CM

## 2011-08-31 DIAGNOSIS — Z7901 Long term (current) use of anticoagulants: Secondary | ICD-10-CM

## 2011-10-12 ENCOUNTER — Ambulatory Visit (INDEPENDENT_AMBULATORY_CARE_PROVIDER_SITE_OTHER): Payer: Medicare Other | Admitting: *Deleted

## 2011-10-12 DIAGNOSIS — Z7901 Long term (current) use of anticoagulants: Secondary | ICD-10-CM

## 2011-10-12 DIAGNOSIS — I4891 Unspecified atrial fibrillation: Secondary | ICD-10-CM

## 2011-10-12 LAB — POCT INR: INR: 3.1

## 2011-10-18 ENCOUNTER — Other Ambulatory Visit: Payer: Self-pay | Admitting: Cardiology

## 2011-11-09 ENCOUNTER — Other Ambulatory Visit: Payer: Self-pay | Admitting: Cardiology

## 2011-11-09 ENCOUNTER — Ambulatory Visit (INDEPENDENT_AMBULATORY_CARE_PROVIDER_SITE_OTHER): Payer: Medicare Other | Admitting: *Deleted

## 2011-11-09 DIAGNOSIS — I4891 Unspecified atrial fibrillation: Secondary | ICD-10-CM

## 2011-11-09 DIAGNOSIS — Z79899 Other long term (current) drug therapy: Secondary | ICD-10-CM

## 2011-11-09 DIAGNOSIS — Z7901 Long term (current) use of anticoagulants: Secondary | ICD-10-CM

## 2011-11-09 DIAGNOSIS — I10 Essential (primary) hypertension: Secondary | ICD-10-CM

## 2011-11-09 LAB — POCT INR: INR: 2.6

## 2011-12-10 ENCOUNTER — Other Ambulatory Visit: Payer: Self-pay | Admitting: Cardiology

## 2011-12-21 ENCOUNTER — Ambulatory Visit (INDEPENDENT_AMBULATORY_CARE_PROVIDER_SITE_OTHER): Payer: Medicare Other | Admitting: *Deleted

## 2011-12-21 DIAGNOSIS — Z7901 Long term (current) use of anticoagulants: Secondary | ICD-10-CM

## 2011-12-21 DIAGNOSIS — I4891 Unspecified atrial fibrillation: Secondary | ICD-10-CM

## 2011-12-21 LAB — POCT INR: INR: 2.3

## 2011-12-31 ENCOUNTER — Encounter: Payer: Self-pay | Admitting: Cardiology

## 2011-12-31 ENCOUNTER — Ambulatory Visit (INDEPENDENT_AMBULATORY_CARE_PROVIDER_SITE_OTHER): Payer: Medicare Other | Admitting: Cardiology

## 2011-12-31 VITALS — BP 128/87 | HR 64 | Ht 72.0 in | Wt 223.8 lb

## 2011-12-31 DIAGNOSIS — I4891 Unspecified atrial fibrillation: Secondary | ICD-10-CM

## 2011-12-31 DIAGNOSIS — I10 Essential (primary) hypertension: Secondary | ICD-10-CM

## 2011-12-31 NOTE — Assessment & Plan Note (Signed)
Blood pressure control is reasonable today. No changes made. 

## 2011-12-31 NOTE — Assessment & Plan Note (Signed)
Quiescent on current medical regimen. No changes made today. Lab work reviewed and normal. Followup in 6 months with CMET.

## 2011-12-31 NOTE — Patient Instructions (Addendum)
Continue all current medications. Your physician wants you to follow up in: 6 months.  You will receive a reminder letter in the mail one-two months in advance.  If you don't receive a letter, please call our office to schedule the follow up appointment  Lab:  CMET - do just prior to next office visit

## 2011-12-31 NOTE — Progress Notes (Signed)
Clinical Summary Mr. Holsinger is a 66 y.o.male presenting for followup. He was seen in May. He reports no significant palpitations or obvious breakthrough atrial fibrillation on the current regimen. ECG today shows sinus rhythm with QRS 98 ms. He reports no bleeding episodes on Coumadin. We did discuss other novel anticoagulants today, and he states that he might check with his insurance provider in terms of possible coverage for any of these agents.  Recent lab work shows BUN 10, creatinine 0.7, AST 27, ALT 26, sodium 139, potassium 4.0.   Allergies  Allergen Reactions  . Dust Mite Extract     Current Outpatient Prescriptions  Medication Sig Dispense Refill  . acetaminophen (TYLENOL) 325 MG tablet Take 650 mg by mouth as needed.        Marland Kitchen azelastine (ASTELIN) 137 MCG/SPRAY nasal spray Place 1 spray into the nose as needed. Use in each nostril as directed       . azelastine (OPTIVAR) 0.05 % ophthalmic solution 1 drop as needed.        . Calcium Carbonate-Vitamin D (CALCIUM + D) 600-200 MG-UNIT TABS Take 1 tablet by mouth 2 (two) times daily.        . cetirizine (ZYRTEC) 10 MG tablet Take 10 mg by mouth daily.        . fish oil-omega-3 fatty acids 1000 MG capsule Take 1 capsule by mouth 2 (two) times daily.        . flecainide (TAMBOCOR) 50 MG tablet Take 50 mg by mouth 2 (two) times daily.      . hydroxyurea (HYDREA) 500 MG capsule Take 500 mg by mouth 3 (three) times a week. May take with food to minimize GI side effects.       Marland Kitchen losartan (COZAAR) 100 MG tablet Take 100 mg by mouth daily.        . metFORMIN (GLUCOPHAGE) 500 MG tablet Take 500 mg by mouth daily.        . pravastatin (PRAVACHOL) 40 MG tablet Take 40 mg by mouth at bedtime.        . verapamil (CALAN-SR) 180 MG CR tablet Take 180 mg by mouth at bedtime.        Marland Kitchen warfarin (COUMADIN) 2.5 MG tablet TAKE AS DIRECTED  70 tablet  0    Past Medical History  Diagnosis Date  . Atrial fibrillation     Ablation at [facility] 2009,  Dr. Deno Lunger  . Diabetes mellitus type II   . Essential hypertension, benign   . ITP (idiopathic thrombocytopenic purpura) 2001  . Primary erythrocytosis     Social History Mr. Nick reports that he has quit smoking. His smoking use included Cigarettes. He has never used smokeless tobacco. Mr. Gangemi reports that he does not drink alcohol.  Review of Systems Some reflux symptoms. Otherwise negative.  Physical Examination Filed Vitals:   12/31/11 0812  BP: 128/87  Pulse: 64   Filed Weights   12/31/11 0812  Weight: 223 lb 12.8 oz (101.515 kg)   Well-nourished male in no acute distress.  HEENT: Conjunctiva and lids normal, oropharynx clear.  Neck: Supple, no carotid bruits, no elevated JVP.  Lungs: Clear to auscultation, nonlabored.  Cardiac: Regular rate and rhythm, no S3.  Skin: Warm and dry.  Extremities: No pitting edema. Distal pulses are full.    Problem List and Plan   ATRIAL FIBRILLATION Quiescent on current medical regimen. No changes made today. Lab work reviewed and normal. Followup in 6 months with CMET.  ESSENTIAL HYPERTENSION, BENIGN Blood pressure control is reasonable today. No changes made.    Jonelle Sidle, M.D., F.A.C.C.

## 2012-01-09 ENCOUNTER — Other Ambulatory Visit: Payer: Self-pay | Admitting: Cardiology

## 2012-01-21 ENCOUNTER — Telehealth: Payer: Self-pay | Admitting: *Deleted

## 2012-01-21 NOTE — Telephone Encounter (Signed)
Patient had tooth extracted today.  Needs to know when he can resume coumadin.  Has been off coumadin since last Thursday. Please call patient.

## 2012-01-21 NOTE — Telephone Encounter (Signed)
Pt told to resume coumadin tonight or tomorrow night depending on amt of bleeding later tonight as he changes packing.

## 2012-02-05 ENCOUNTER — Ambulatory Visit (INDEPENDENT_AMBULATORY_CARE_PROVIDER_SITE_OTHER): Payer: Medicare Other | Admitting: *Deleted

## 2012-02-05 DIAGNOSIS — Z7901 Long term (current) use of anticoagulants: Secondary | ICD-10-CM

## 2012-02-05 DIAGNOSIS — I4891 Unspecified atrial fibrillation: Secondary | ICD-10-CM

## 2012-02-05 LAB — POCT INR: INR: 2.5

## 2012-03-06 ENCOUNTER — Other Ambulatory Visit: Payer: Self-pay | Admitting: Cardiology

## 2012-03-06 MED ORDER — FLECAINIDE ACETATE 50 MG PO TABS: 50.0000 mg | ORAL_TABLET | Freq: Two times a day (BID) | ORAL | Status: DC

## 2012-03-06 MED ORDER — WARFARIN SODIUM 2.5 MG PO TABS: 2.5000 mg | ORAL_TABLET | Freq: Every day | ORAL | Status: DC

## 2012-03-11 ENCOUNTER — Other Ambulatory Visit: Payer: Self-pay | Admitting: Cardiology

## 2012-03-11 MED ORDER — WARFARIN SODIUM 2.5 MG PO TABS: 2.5000 mg | ORAL_TABLET | Freq: Every day | ORAL | Status: DC

## 2012-03-11 MED ORDER — FLECAINIDE ACETATE 50 MG PO TABS: 50.0000 mg | ORAL_TABLET | Freq: Two times a day (BID) | ORAL | Status: DC

## 2012-03-11 NOTE — Telephone Encounter (Signed)
Informed patient that refill request for flecainide and warfarin was re faxed into [facility].

## 2012-03-18 ENCOUNTER — Ambulatory Visit (INDEPENDENT_AMBULATORY_CARE_PROVIDER_SITE_OTHER): Payer: Medicare Other | Admitting: *Deleted

## 2012-03-18 DIAGNOSIS — Z7901 Long term (current) use of anticoagulants: Secondary | ICD-10-CM

## 2012-03-18 DIAGNOSIS — I4891 Unspecified atrial fibrillation: Secondary | ICD-10-CM

## 2012-04-08 ENCOUNTER — Other Ambulatory Visit: Payer: Self-pay | Admitting: *Deleted

## 2012-04-08 ENCOUNTER — Ambulatory Visit (INDEPENDENT_AMBULATORY_CARE_PROVIDER_SITE_OTHER): Payer: Medicare Other | Admitting: *Deleted

## 2012-04-08 DIAGNOSIS — I10 Essential (primary) hypertension: Secondary | ICD-10-CM

## 2012-04-08 DIAGNOSIS — Z79899 Other long term (current) drug therapy: Secondary | ICD-10-CM

## 2012-04-08 DIAGNOSIS — I4891 Unspecified atrial fibrillation: Secondary | ICD-10-CM

## 2012-04-08 DIAGNOSIS — Z5181 Encounter for therapeutic drug level monitoring: Secondary | ICD-10-CM

## 2012-04-08 DIAGNOSIS — Z7901 Long term (current) use of anticoagulants: Secondary | ICD-10-CM

## 2012-04-08 NOTE — Progress Notes (Signed)
Orders faxed to [facility] lab.

## 2012-04-12 ENCOUNTER — Other Ambulatory Visit: Payer: Self-pay

## 2012-05-06 ENCOUNTER — Ambulatory Visit (INDEPENDENT_AMBULATORY_CARE_PROVIDER_SITE_OTHER): Payer: Medicare Other | Admitting: *Deleted

## 2012-05-06 DIAGNOSIS — Z7901 Long term (current) use of anticoagulants: Secondary | ICD-10-CM

## 2012-05-06 DIAGNOSIS — I4891 Unspecified atrial fibrillation: Secondary | ICD-10-CM

## 2012-05-14 ENCOUNTER — Other Ambulatory Visit: Payer: Self-pay | Admitting: *Deleted

## 2012-05-14 MED ORDER — WARFARIN SODIUM 2.5 MG PO TABS: 2.5000 mg | ORAL_TABLET | Freq: Every day | ORAL | Status: DC

## 2012-05-15 ENCOUNTER — Telehealth: Payer: Self-pay | Admitting: *Deleted

## 2012-05-15 MED ORDER — WARFARIN SODIUM 2.5 MG PO TABS: 2.5000 mg | ORAL_TABLET | Freq: Every day | ORAL | Status: DC

## 2012-05-15 NOTE — Telephone Encounter (Signed)
Noted incoming fax for pt to receive warfarin 2.5mg , noted pt up to date on follow up/Pt checks, sent in via escribe to [facility]

## 2012-05-22 ENCOUNTER — Other Ambulatory Visit: Payer: Self-pay | Admitting: *Deleted

## 2012-05-22 ENCOUNTER — Telehealth: Payer: Self-pay | Admitting: *Deleted

## 2012-05-22 MED ORDER — WARFARIN SODIUM 2.5 MG PO TABS: ORAL_TABLET | ORAL | Status: DC

## 2012-05-22 NOTE — Telephone Encounter (Signed)
Noted incoming fax to clarify pt warfarin 2.5mg  per noted 2 sets of directions for this pt dosage  Spoke to Vashti Hey RN coumadin nurse to confirm pt is currently taking 2 tabs daily except for 3 tabs on sun, tues and Thursday  Spoke to [place] with [facility] [employer] to advise, was transferred to pharmacist scott whom then advised the clarification came through via escribe earlier today, noted pt refill will be mailed in 7-10 bus days

## 2012-06-03 ENCOUNTER — Ambulatory Visit (INDEPENDENT_AMBULATORY_CARE_PROVIDER_SITE_OTHER): Payer: Medicare Other | Admitting: *Deleted

## 2012-06-03 DIAGNOSIS — I4891 Unspecified atrial fibrillation: Secondary | ICD-10-CM

## 2012-06-03 DIAGNOSIS — Z7901 Long term (current) use of anticoagulants: Secondary | ICD-10-CM

## 2012-06-03 LAB — POCT INR: INR: 3.1

## 2012-07-01 ENCOUNTER — Ambulatory Visit (INDEPENDENT_AMBULATORY_CARE_PROVIDER_SITE_OTHER): Payer: Medicare Other | Admitting: *Deleted

## 2012-07-01 DIAGNOSIS — Z7901 Long term (current) use of anticoagulants: Secondary | ICD-10-CM

## 2012-07-01 DIAGNOSIS — I4891 Unspecified atrial fibrillation: Secondary | ICD-10-CM

## 2012-07-01 LAB — POCT INR: INR: 2.5

## 2012-07-07 ENCOUNTER — Ambulatory Visit: Payer: Medicare Other | Admitting: Cardiology

## 2012-07-15 ENCOUNTER — Ambulatory Visit: Payer: Medicare Other | Admitting: Cardiology

## 2012-07-29 ENCOUNTER — Ambulatory Visit (INDEPENDENT_AMBULATORY_CARE_PROVIDER_SITE_OTHER): Payer: Medicare Other | Admitting: *Deleted

## 2012-07-29 DIAGNOSIS — Z7901 Long term (current) use of anticoagulants: Secondary | ICD-10-CM

## 2012-07-29 DIAGNOSIS — I4891 Unspecified atrial fibrillation: Secondary | ICD-10-CM

## 2012-07-29 LAB — POCT INR: INR: 2.5

## 2012-08-06 ENCOUNTER — Encounter: Payer: Self-pay | Admitting: Cardiology

## 2012-08-06 ENCOUNTER — Ambulatory Visit (INDEPENDENT_AMBULATORY_CARE_PROVIDER_SITE_OTHER): Payer: Medicare Other | Admitting: Cardiology

## 2012-08-06 VITALS — BP 93/59 | HR 76 | Ht 72.0 in | Wt 216.0 lb

## 2012-08-06 DIAGNOSIS — I10 Essential (primary) hypertension: Secondary | ICD-10-CM

## 2012-08-06 DIAGNOSIS — I4891 Unspecified atrial fibrillation: Secondary | ICD-10-CM

## 2012-08-06 NOTE — Patient Instructions (Addendum)
Your physician recommends that you schedule a follow-up appointment in: 6 months. You will receive a reminder letter in the mail in about 4 months reminding you to call and schedule your appointment. If you don't receive this letter, please contact our office. Your physician recommends that you continue on your current medications as directed. Please refer to the Current Medication list given to you today. Your physician recommends that you return for lab work in: 6 months just before your next visit. You will receive a reminder letter/call in the mail in about months reminding you to have this done before your next appointment. If you don't receive this letter/call, please contact our office.

## 2012-08-06 NOTE — Assessment & Plan Note (Signed)
Continue current management including anticoagulation and antiarrhythmic therapy. ECG is stable. Followup arranged with CMET.

## 2012-08-06 NOTE — Progress Notes (Signed)
   Clinical Summary [name] is a 67 y.o.male last seen in November 2013. He reports intermittent, typically brief breakthrough atrial fibrillation based on symptoms of palpitations. There has been no significant progression. He reports compliance with his medications, no significant bleeding problems with Coumadin.  Recent lab work shows BUN 15, creatinine 0.8, sodium 136, potassium 3.7, AST 29, ALT 25. We discussed this today.  ECG shows normal sinus rhythm with stable QRS duration 95 ms.   Allergies  Allergen Reactions  . Dust Mite Extract     Current Outpatient Prescriptions  Medication Sig Dispense Refill  . acetaminophen (TYLENOL) 325 MG tablet Take 650 mg by mouth as needed.        . cetirizine (ZYRTEC) 10 MG tablet Take 10 mg by mouth daily.        . flecainide (TAMBOCOR) 50 MG tablet Take 1 tablet (50 mg total) by mouth 2 (two) times daily.  180 tablet  3  . fluticasone (FLONASE) 50 MCG/ACT nasal spray Place 2 sprays into the nose daily.      . hydroxyurea (HYDREA) 500 MG capsule Take 500 mg by mouth 3 (three) times a week. May take with food to minimize GI side effects.       Marland Kitchen losartan (COZAAR) 100 MG tablet Take 100 mg by mouth daily.        . metFORMIN (GLUCOPHAGE) 500 MG tablet Take 500 mg by mouth daily.        . pravastatin (PRAVACHOL) 40 MG tablet Take 40 mg by mouth at bedtime.        . verapamil (CALAN-SR) 180 MG CR tablet Take 180 mg by mouth at bedtime.        Marland Kitchen warfarin (COUMADIN) 2.5 MG tablet Take 3 tablets on Sunday,Tuesday and Thursday.  2 tablets all other days.  210 tablet  0   No current facility-administered medications for this visit.    Past Medical History  Diagnosis Date  . Atrial fibrillation     Ablation at [facility] 2009, Dr. Deno Lunger  . Diabetes mellitus type II   . Essential hypertension, benign   . ITP (idiopathic thrombocytopenic purpura) 2001  . Primary erythrocytosis     Social History [name] reports that he has quit  smoking. His smoking use included Cigarettes. He smoked 0.00 packs per day. He has never used smokeless tobacco. [name] reports that he does not drink alcohol.  Review of Systems Negative except as outlined.  Physical Examination Filed Vitals:   08/06/12 1334  BP: 93/59  Pulse: 76   Filed Weights   08/06/12 1334  Weight: 216 lb (97.977 kg)    Well-nourished male, comfortable at rest.  HEENT: Conjunctiva and lids normal, oropharynx clear.  Neck: Supple, no carotid bruits, no elevated JVP.  Lungs: Clear to auscultation, nonlabored.  Cardiac: Regular rate and rhythm, no S3.  Skin: Warm and dry.  Extremities: No pitting edema. Distal pulses are full.    Problem List and Plan   Paroxysmal atrial fibrillation Continue current management including anticoagulation and antiarrhythmic therapy. ECG is stable. Followup arranged with CMET.  Essential hypertension, benign Blood pressure low normal today, asymptomatic. Keep regular followup with Dr. Margo Common.    Jonelle Sidle, M.D., F.A.C.C.

## 2012-08-06 NOTE — Assessment & Plan Note (Signed)
Blood pressure low normal today, asymptomatic. Keep regular followup with Dr. Margo Common.

## 2012-09-12 ENCOUNTER — Ambulatory Visit (INDEPENDENT_AMBULATORY_CARE_PROVIDER_SITE_OTHER): Payer: Medicare Other | Admitting: *Deleted

## 2012-09-12 DIAGNOSIS — I4891 Unspecified atrial fibrillation: Secondary | ICD-10-CM

## 2012-09-12 DIAGNOSIS — I480 Paroxysmal atrial fibrillation: Secondary | ICD-10-CM

## 2012-09-12 DIAGNOSIS — Z7901 Long term (current) use of anticoagulants: Secondary | ICD-10-CM

## 2012-09-12 LAB — POCT INR: INR: 2.6

## 2012-10-01 ENCOUNTER — Other Ambulatory Visit: Payer: Self-pay

## 2012-10-24 ENCOUNTER — Ambulatory Visit (INDEPENDENT_AMBULATORY_CARE_PROVIDER_SITE_OTHER): Payer: Medicare Other | Admitting: *Deleted

## 2012-10-24 DIAGNOSIS — Z7901 Long term (current) use of anticoagulants: Secondary | ICD-10-CM

## 2012-10-24 DIAGNOSIS — I4891 Unspecified atrial fibrillation: Secondary | ICD-10-CM

## 2012-10-24 DIAGNOSIS — I480 Paroxysmal atrial fibrillation: Secondary | ICD-10-CM

## 2012-12-05 ENCOUNTER — Other Ambulatory Visit: Payer: Self-pay | Admitting: Cardiology

## 2012-12-09 ENCOUNTER — Ambulatory Visit (INDEPENDENT_AMBULATORY_CARE_PROVIDER_SITE_OTHER): Payer: Medicare Other | Admitting: *Deleted

## 2012-12-09 DIAGNOSIS — I4891 Unspecified atrial fibrillation: Secondary | ICD-10-CM

## 2012-12-09 DIAGNOSIS — Z7901 Long term (current) use of anticoagulants: Secondary | ICD-10-CM

## 2012-12-09 DIAGNOSIS — I480 Paroxysmal atrial fibrillation: Secondary | ICD-10-CM

## 2012-12-09 LAB — POCT INR: INR: 2

## 2012-12-09 MED ORDER — WARFARIN SODIUM 2.5 MG PO TABS: ORAL_TABLET | ORAL | Status: DC

## 2013-01-01 ENCOUNTER — Other Ambulatory Visit: Payer: Self-pay

## 2013-01-16 ENCOUNTER — Ambulatory Visit (INDEPENDENT_AMBULATORY_CARE_PROVIDER_SITE_OTHER): Payer: Medicare Other | Admitting: *Deleted

## 2013-01-16 DIAGNOSIS — Z7901 Long term (current) use of anticoagulants: Secondary | ICD-10-CM

## 2013-01-16 DIAGNOSIS — I480 Paroxysmal atrial fibrillation: Secondary | ICD-10-CM

## 2013-01-16 DIAGNOSIS — I4891 Unspecified atrial fibrillation: Secondary | ICD-10-CM

## 2013-01-16 LAB — POCT INR: INR: 2.5

## 2013-02-09 ENCOUNTER — Encounter: Payer: Self-pay | Admitting: Cardiology

## 2013-02-09 ENCOUNTER — Ambulatory Visit (INDEPENDENT_AMBULATORY_CARE_PROVIDER_SITE_OTHER): Payer: Medicare Other | Admitting: Cardiology

## 2013-02-09 VITALS — BP 133/86 | HR 67 | Ht 72.0 in | Wt 221.4 lb

## 2013-02-09 DIAGNOSIS — I10 Essential (primary) hypertension: Secondary | ICD-10-CM

## 2013-02-09 DIAGNOSIS — I480 Paroxysmal atrial fibrillation: Secondary | ICD-10-CM

## 2013-02-09 DIAGNOSIS — I4891 Unspecified atrial fibrillation: Secondary | ICD-10-CM

## 2013-02-09 NOTE — Progress Notes (Signed)
Clinical Summary Mr. Boody is a 67 y.o.male last seen in June. He has been doing well. Does have intermittent palpitations, breakthrough atrial fibrillation, no prolonged events however. Feels fatigued when this happens. Otherwise no exertional chest pain or limitations with his typical ADLs. He states he works in the yard without limitation, also enjoys fishing.  Lab work from June showed BUN 15, creatinine 0.8, AST 29, ALT 25, potassium 3.7.  No significant bleeding problems on Coumadin. No change in Tambocor dose.    Allergies  Allergen Reactions  . Dust Mite Extract     Current Outpatient Prescriptions  Medication Sig Dispense Refill  . acetaminophen (TYLENOL) 325 MG tablet Take 650 mg by mouth as needed.        Marland Kitchen b complex vitamins tablet Take 1 tablet by mouth daily.      . Calcium Carb-Cholecalciferol (CALCIUM 600 + D PO) Take 1 tablet by mouth 2 (two) times daily.      . cetirizine (ZYRTEC) 10 MG tablet Take 10 mg by mouth daily.        . flecainide (TAMBOCOR) 50 MG tablet Take 1 tablet (50 mg total) by mouth 2 (two) times daily.  180 tablet  3  . fluticasone (FLONASE) 50 MCG/ACT nasal spray Place 2 sprays into the nose daily.      . hydroxyurea (HYDREA) 500 MG capsule Take 500 mg by mouth 3 (three) times a week. May take with food to minimize GI side effects.       Marland Kitchen losartan (COZAAR) 100 MG tablet Take 100 mg by mouth daily.        . metFORMIN (GLUCOPHAGE) 500 MG tablet Take 500 mg by mouth daily.        . Omega-3 Fatty Acids (FISH OIL) 1000 MG CAPS Take 1 capsule by mouth 2 (two) times daily.      . pravastatin (PRAVACHOL) 40 MG tablet Take 40 mg by mouth at bedtime.        . verapamil (CALAN-SR) 180 MG CR tablet Take 180 mg by mouth at bedtime.        Marland Kitchen warfarin (COUMADIN) 2.5 MG tablet 5 mg every day except 7.5 mg Sun, Tues and Thurs MANAGED BY LISA       No current facility-administered medications for this visit.    Past Medical History  Diagnosis Date  .  Atrial fibrillation     Ablation at [facility] 2009, Dr. Deno Lunger  . Diabetes mellitus type II   . Essential hypertension, benign   . ITP (idiopathic thrombocytopenic purpura) 2001  . Primary erythrocytosis     Social History Mr. Waterworth reports that he has quit smoking. His smoking use included Cigarettes. He smoked 0.00 packs per day. He has never used smokeless tobacco. Mr. Swaim reports that he does not drink alcohol.  Review of Systems Negative except as outlined.  Physical Examination Filed Vitals:   02/09/13 0950  BP: 133/86  Pulse: 67   Filed Weights   02/09/13 0950  Weight: 221 lb 6.4 oz (100.426 kg)    Well-nourished male, comfortable at rest.  HEENT: Conjunctiva and lids normal, oropharynx clear.  Neck: Supple, no carotid bruits, no elevated JVP.  Lungs: Clear to auscultation, nonlabored.  Cardiac: Regular rate and rhythm, no S3.  Skin: Warm and dry.  Extremities: No pitting edema. Distal pulses are full.    Problem List and Plan   Paroxysmal atrial fibrillation Stable, continue current regimen including Coumadin, Tambocor, and Calan SR. Due  for physical with Dr. Margo Common, lab work to be obtained at that time.  Essential hypertension, benign No change to current regimen.    Jonelle Sidle, M.D., F.A.C.C.

## 2013-02-09 NOTE — Assessment & Plan Note (Signed)
No change to current regimen. 

## 2013-02-09 NOTE — Patient Instructions (Signed)

## 2013-02-09 NOTE — Assessment & Plan Note (Signed)
Stable, continue current regimen including Coumadin, Tambocor, and Calan SR. Due for physical with Dr. Margo Common, lab work to be obtained at that time.

## 2013-02-27 ENCOUNTER — Ambulatory Visit (INDEPENDENT_AMBULATORY_CARE_PROVIDER_SITE_OTHER): Payer: Medicare Other | Admitting: *Deleted

## 2013-02-27 DIAGNOSIS — Z7901 Long term (current) use of anticoagulants: Secondary | ICD-10-CM

## 2013-02-27 DIAGNOSIS — I480 Paroxysmal atrial fibrillation: Secondary | ICD-10-CM

## 2013-02-27 DIAGNOSIS — I4891 Unspecified atrial fibrillation: Secondary | ICD-10-CM

## 2013-02-27 LAB — POCT INR: INR: 2.5

## 2013-02-28 ENCOUNTER — Other Ambulatory Visit: Payer: Self-pay | Admitting: Cardiology

## 2013-04-17 ENCOUNTER — Ambulatory Visit (INDEPENDENT_AMBULATORY_CARE_PROVIDER_SITE_OTHER): Payer: Medicare Other | Admitting: *Deleted

## 2013-04-17 DIAGNOSIS — I480 Paroxysmal atrial fibrillation: Secondary | ICD-10-CM

## 2013-04-17 DIAGNOSIS — I4891 Unspecified atrial fibrillation: Secondary | ICD-10-CM

## 2013-04-17 DIAGNOSIS — Z5181 Encounter for therapeutic drug level monitoring: Secondary | ICD-10-CM | POA: Insufficient documentation

## 2013-04-17 DIAGNOSIS — Z7901 Long term (current) use of anticoagulants: Secondary | ICD-10-CM

## 2013-04-17 LAB — POCT INR: INR: 2.4

## 2013-05-29 ENCOUNTER — Ambulatory Visit (INDEPENDENT_AMBULATORY_CARE_PROVIDER_SITE_OTHER): Payer: Medicare Other | Admitting: *Deleted

## 2013-05-29 DIAGNOSIS — I480 Paroxysmal atrial fibrillation: Secondary | ICD-10-CM

## 2013-05-29 DIAGNOSIS — I4891 Unspecified atrial fibrillation: Secondary | ICD-10-CM

## 2013-05-29 DIAGNOSIS — Z7901 Long term (current) use of anticoagulants: Secondary | ICD-10-CM

## 2013-05-29 DIAGNOSIS — Z5181 Encounter for therapeutic drug level monitoring: Secondary | ICD-10-CM

## 2013-05-29 LAB — POCT INR: INR: 2.4

## 2013-07-19 ENCOUNTER — Ambulatory Visit (INDEPENDENT_AMBULATORY_CARE_PROVIDER_SITE_OTHER): Payer: Medicare Other | Admitting: Nurse Practitioner

## 2013-07-19 ENCOUNTER — Encounter (INDEPENDENT_AMBULATORY_CARE_PROVIDER_SITE_OTHER): Payer: Self-pay | Admitting: Nurse Practitioner

## 2013-07-19 VITALS — BP 146/83 | HR 76 | Temp 98.2°F | Resp 20 | Ht 72.0 in | Wt 220.0 lb

## 2013-07-19 DIAGNOSIS — S139XXA Sprain of joints and ligaments of unspecified parts of neck, initial encounter: Secondary | ICD-10-CM

## 2013-07-19 DIAGNOSIS — S161XXA Strain of muscle, fascia and tendon at neck level, initial encounter: Secondary | ICD-10-CM

## 2013-07-19 MED ORDER — OXYCODONE-ACETAMINOPHEN 5-325 MG PO TABS
1.0000 | ORAL_TABLET | Freq: Four times a day (QID) | ORAL | Status: DC | PRN
Start: 2013-07-19 — End: 2017-06-13

## 2013-07-19 MED ORDER — CYCLOBENZAPRINE HCL 10 MG PO TABS
5.0000 mg | ORAL_TABLET | Freq: Three times a day (TID) | ORAL | Status: AC | PRN
Start: 2013-07-19 — End: 2014-07-19

## 2013-07-19 MED ORDER — OXYCODONE-ACETAMINOPHEN 5-325 MG PO TABS
1.0000 | ORAL_TABLET | Freq: Four times a day (QID) | ORAL | Status: DC | PRN
Start: 2013-07-19 — End: 2013-07-19

## 2013-07-19 MED ORDER — CYCLOBENZAPRINE HCL 10 MG PO TABS
5.0000 mg | ORAL_TABLET | Freq: Three times a day (TID) | ORAL | Status: DC | PRN
Start: 2013-07-19 — End: 2013-07-19

## 2013-07-19 NOTE — Progress Notes (Signed)
Subjective:       Patient ID: Corey Pennington is a 68 y.o. male.  Chief Complaint   Patient presents with   . Neck Pain     Neck Stiffnes for five days and started for no reason...gets worse at night at times   He was in [place] left [place] flew 10 hours flight time, stayed  Ten days. Took another trip to [facility] while there for three days and drove. swam in pool, was on helicopter ride, was on mountain climbing trip. Lots of walking , last wed began to feel pain in neck. Pain also at night, feels stiff. Was in a small car and had pain getting in/out  No vision changes, no numbness tingling.No headache, no jaw pain.     Neck Pain   This is a new problem. The current episode started in the past 7 days. The problem occurs constantly. The problem has been gradually worsening. The pain is associated with an unknown (maybe twisting) factor. Pain location: back of neck. The quality of the pain is described as aching and shooting. The pain is at a severity of 4/10. The symptoms are aggravated by bending and twisting. The pain is worse during the night. Stiffness is present in the morning. Pertinent negatives include no chest pain, fever, headaches, leg pain, numbness, pain with swallowing, paresis, photophobia, syncope, trouble swallowing, visual change, weakness or weight loss. He has tried acetaminophen for the symptoms. The treatment provided mild relief.       The following portions of the patient's history were reviewed and updated as appropriate: allergies, current medications, past family history, past medical history, past social history, past surgical history and problem list.    Review of Systems   Constitutional: Negative for fever and weight loss.   HENT: Negative for trouble swallowing.    Eyes: Negative for photophobia.   Cardiovascular: Negative for chest pain and syncope.   Musculoskeletal: Positive for neck pain.   Neurological: Negative for weakness, numbness and headaches.           Objective:     Physical  Exam   Constitutional: He is oriented to person, place, and time. Vital signs are normal. He appears well-developed and well-nourished. He does not have a sickly appearance.   HENT:   Head: Normocephalic and atraumatic.   Mouth/Throat: Oropharynx is clear and moist.   Eyes: Conjunctivae, EOM and lids are normal. Pupils are equal, round, and reactive to light.   Neck: Trachea normal. No spinous process tenderness and no muscular tenderness present. Carotid bruit is not present. No rigidity. No erythema and normal range of motion present. No thyroid mass present.   Cardiovascular: Normal rate, S1 normal and S2 normal.    Pulmonary/Chest: Effort normal and breath sounds normal. No respiratory distress.   Musculoskeletal:        Cervical back: He exhibits decreased range of motion. He exhibits no tenderness, no bony tenderness, no swelling, no edema, no deformity, no laceration, no pain, no spasm and normal pulse.   Neurological: He is alert and oriented to person, place, and time. He has normal strength. No cranial nerve deficit or sensory deficit. Coordination normal. GCS eye subscore is 4. GCS verbal subscore is 5. GCS motor subscore is 6.   Skin: Skin is warm and dry. No rash noted.     Equal hand grasps  Negative stroke screen       Assessment:       1. Strain of neck muscle,  initial encounter    - oxyCODONE-acetaminophen (PERCOCET) 5-325 MG per tablet; Take 1 tablet by mouth every 6 (six) hours as needed for Pain.  Dispense: 20 tablet; Refill: 0  - cyclobenzaprine (FLEXERIL) 10 MG tablet; Take 0.5 tablets (5 mg total) by mouth every 8 (eight) hours as needed for Muscle spasms.  Dispense: 30 tablet; Refill: 0      Plan:             Seek medical attention if not improving or concerns

## 2013-07-19 NOTE — Patient Instructions (Signed)
Neck Sprain Or Strain  A sudden force that causes turning or bending of the neck (such as in a car accident) can stretch or tear muscles (strain) and ligaments (sprain) and cause neck pain. Sometimes neck pain occurs after a simple awkward movement. In either case, muscle spasm is commonly present and contributes to the pain.    Unless you had a forceful physical injury (for example, a car accident or fall), X-rays are usually not ordered for the initial evaluation of neck pain. If pain continues and dose not respond to medical treatment, X-rays and other tests may be performed at a later time.  Home care  The following guidelines will help you care for your injury at home:   You may feel more soreness and spasm the first few days after the injury. Reduce your activity level until symptoms begin to improve.   When lying down, use a comfortable pillow that supports the head and keeps the spine in a neutral position. The position of the head should not be tilted forward or backward.   Use ice packs (ice in a plastic bag, wrapped in a towel) to treat acute pain. Apply for 20 minutes every 2-4 hours during the first two days. Then, begin local heat (hot shower, hot bath or heating pad) andmassageto reduce muscle spasm. Some patients feel best alternating hot and cold treatments, or just staying with one method only. Do what feels the best to you and gives the most relief.   You may use acetaminophen or ibuprofen to control pain, unless another pain medicine was prescribed.If you have chronic liver or kidney disease or ever had a stomach ulcer or GI bleeding, talk with your doctor before using these medicines.  Follow-up care  Follow up with your physician or this facility if your symptoms do not show signs of improvement. Physical therapy may be needed.  If you had X-rays today, they didn't show any broken bones, breaks, or fractures. Sometimes fractures don't show up on the first X-ray. Bruises and sprains can  sometimes hurt as much as a fracture. These injuries can take time to heal completely. If your symptoms don't improve or they get worse, talk with your doctor. You may need a repeat X-ray.  When to seek medical care  Get prompt medical attention if any of the following occur:   Pain becomes worse or spreads into your arms   Weakness or numbness in one or both arms   2000-2014 [employer], [address], [place] [address]. All rights reserved. This information is not intended as a substitute for professional medical care. Always follow your healthcare professional's instructions.

## 2013-07-21 ENCOUNTER — Ambulatory Visit (INDEPENDENT_AMBULATORY_CARE_PROVIDER_SITE_OTHER): Payer: Medicare Other | Admitting: *Deleted

## 2013-07-21 ENCOUNTER — Telehealth: Payer: Self-pay | Admitting: Cardiology

## 2013-07-21 DIAGNOSIS — Z5181 Encounter for therapeutic drug level monitoring: Secondary | ICD-10-CM

## 2013-07-21 DIAGNOSIS — I480 Paroxysmal atrial fibrillation: Secondary | ICD-10-CM

## 2013-07-21 DIAGNOSIS — Z7901 Long term (current) use of anticoagulants: Secondary | ICD-10-CM

## 2013-07-21 DIAGNOSIS — I4891 Unspecified atrial fibrillation: Secondary | ICD-10-CM

## 2013-07-21 LAB — POCT INR: INR: 2.2

## 2013-07-21 MED ORDER — FLECAINIDE ACETATE 50 MG PO TABS: 50.0000 mg | ORAL_TABLET | Freq: Two times a day (BID) | ORAL | Status: DC

## 2013-07-21 NOTE — Telephone Encounter (Signed)
Done

## 2013-07-21 NOTE — Telephone Encounter (Signed)
flecainide (TAMBOCOR) 50 MG tablet   [facility] - [place], [place] - [address]

## 2013-07-22 ENCOUNTER — Telehealth (INDEPENDENT_AMBULATORY_CARE_PROVIDER_SITE_OTHER): Payer: Self-pay

## 2013-07-22 NOTE — Telephone Encounter (Signed)
Discharge call; pt states to be doing better, satisfied with care provided

## 2013-08-11 ENCOUNTER — Encounter: Payer: Self-pay | Admitting: Cardiology

## 2013-08-11 ENCOUNTER — Encounter: Payer: Medicare Other | Admitting: Cardiology

## 2013-08-11 NOTE — Progress Notes (Signed)
Patient canceled.  This encounter was created in error - please disregard. 

## 2013-09-11 ENCOUNTER — Encounter: Payer: Self-pay | Admitting: Cardiology

## 2013-09-11 ENCOUNTER — Ambulatory Visit (INDEPENDENT_AMBULATORY_CARE_PROVIDER_SITE_OTHER): Payer: Medicare Other | Admitting: Cardiology

## 2013-09-11 ENCOUNTER — Ambulatory Visit (INDEPENDENT_AMBULATORY_CARE_PROVIDER_SITE_OTHER): Payer: Medicare Other | Admitting: *Deleted

## 2013-09-11 VITALS — BP 118/74 | HR 70 | Ht 72.0 in | Wt 214.1 lb

## 2013-09-11 DIAGNOSIS — I4891 Unspecified atrial fibrillation: Secondary | ICD-10-CM

## 2013-09-11 DIAGNOSIS — I480 Paroxysmal atrial fibrillation: Secondary | ICD-10-CM

## 2013-09-11 DIAGNOSIS — Z5181 Encounter for therapeutic drug level monitoring: Secondary | ICD-10-CM

## 2013-09-11 DIAGNOSIS — I10 Essential (primary) hypertension: Secondary | ICD-10-CM

## 2013-09-11 DIAGNOSIS — Z7901 Long term (current) use of anticoagulants: Secondary | ICD-10-CM

## 2013-09-11 LAB — POCT INR: INR: 2.4

## 2013-09-11 NOTE — Assessment & Plan Note (Signed)
Blood pressure is normal today. 

## 2013-09-11 NOTE — Patient Instructions (Signed)

## 2013-09-11 NOTE — Assessment & Plan Note (Signed)
Reasonable symptom control on current regimen, no changes made today. ECG reviewed.

## 2013-09-11 NOTE — Progress Notes (Signed)
    Clinical Summary [name] is a 68 y.o.male last seen in December 2014. He reports occasional palpitations and presumably breakthrough atrial fibrillation, but not prolonged events on current medical regimen which remains unchanged. His ECG today shows normal sinus rhythm with prolonged PR interval, normal QRS duration.  Lab work from December 2014 showed BUN 18, creatinine 0.9, AST 31, ALT 29, triglycerides 118, cholesterol 156, HDL 35, LDL 97, potassium 3.9, hemoglobin A1c 5.8, hemoglobin 15.8, platelets 160.  He has had no major bleeding problems on Coumadin.   Allergies  Allergen Reactions  . Dust Mite Extract     Current Outpatient Prescriptions  Medication Sig Dispense Refill  . acetaminophen (TYLENOL) 325 MG tablet Take 650 mg by mouth as needed.        Marland Kitchen. b complex vitamins tablet Take 1 tablet by mouth daily.      . cetirizine (ZYRTEC) 10 MG tablet Take 10 mg by mouth daily.        . flecainide (TAMBOCOR) 50 MG tablet Take 1 tablet (50 mg total) by mouth 2 (two) times daily.  180 tablet  3  . fluticasone (FLONASE) 50 MCG/ACT nasal spray Place 2 sprays into the nose daily.      . hydroxyurea (HYDREA) 500 MG capsule Take 500 mg by mouth 3 (three) times a week. May take with food to minimize GI side effects.       Marland Kitchen. losartan (COZAAR) 100 MG tablet Take 100 mg by mouth daily.        . metFORMIN (GLUCOPHAGE) 500 MG tablet Take 500 mg by mouth daily.        . pravastatin (PRAVACHOL) 40 MG tablet Take 40 mg by mouth at bedtime.        . verapamil (CALAN-SR) 180 MG CR tablet Take 180 mg by mouth at bedtime.        Marland Kitchen. warfarin (COUMADIN) 2.5 MG tablet TAKE 3 TABLETS (7.5MG ) ON SUNDAY, TUESDAY AND THURSDAY AND TAKE 2 TABLETS (5MG ) ALL OTHER DAYS  210 tablet  3   No current facility-administered medications for this visit.    Past Medical History  Diagnosis Date  . Atrial fibrillation     [facility]blation [facility], Dr. Deno Pham  . Diabetes mellitus type II   . Essential  hypertension, benign   . ITP (idiopathic thrombocytopenic purpura) 2001  . Primary erythrocytosis     Social History [name]. His smoking use included Cigarettes. He smoked 0.00 packs per day. He has never used smokeless tobacco. [name] reports that he does not drink alcohol.  Review of Systems States that he had a "strained neck" in the interim, evaluated by Dr. Margo Pham. Other systems reviewed and negative.  Physical Examination Filed Vitals:   09/11/13 1045  BP: 118/74  Pulse: 70   Filed Weights   09/11/13 1045  Weight: 214 lb 1.9 oz (97.124 kg)    Well-nourished male, comfortable at rest.  HEENT: Conjunctiva and lids normal, oropharynx clear.  Neck: Supple, no carotid bruits, no elevated JVP.  Lungs: Clear to auscultation, nonlabored.  Cardiac: Regular rate and rhythm, no S3.  Skin: Warm and dry.  Extremities: No pitting edema. Distal pulses are full.    Problem List and Plan   Paroxysmal atrial fibrillation Reasonable symptom control on current regimen, no changes made today. ECG reviewed.  Essential hypertension, benign Blood pressure is normal today.    [name], M.D., F.A.C.C.

## 2013-09-30 ENCOUNTER — Telehealth: Payer: Self-pay | Admitting: Cardiology

## 2013-09-30 MED ORDER — WARFARIN SODIUM 2.5 MG PO TABS
ORAL_TABLET | ORAL | Status: DC
Start: 2013-09-30 — End: 2014-07-20

## 2013-09-30 NOTE — Telephone Encounter (Signed)
Needs refill on Coumdin [facility] - [place], [place] - [address]

## 2013-11-03 ENCOUNTER — Ambulatory Visit (INDEPENDENT_AMBULATORY_CARE_PROVIDER_SITE_OTHER): Payer: Medicare Other | Admitting: *Deleted

## 2013-11-03 DIAGNOSIS — Z7901 Long term (current) use of anticoagulants: Secondary | ICD-10-CM

## 2013-11-03 DIAGNOSIS — Z5181 Encounter for therapeutic drug level monitoring: Secondary | ICD-10-CM

## 2013-11-03 DIAGNOSIS — I480 Paroxysmal atrial fibrillation: Secondary | ICD-10-CM

## 2013-11-03 DIAGNOSIS — I4891 Unspecified atrial fibrillation: Secondary | ICD-10-CM

## 2013-11-03 LAB — POCT INR: INR: 2.5

## 2013-12-15 ENCOUNTER — Ambulatory Visit (INDEPENDENT_AMBULATORY_CARE_PROVIDER_SITE_OTHER): Payer: Medicare Other | Admitting: *Deleted

## 2013-12-15 DIAGNOSIS — Z7901 Long term (current) use of anticoagulants: Secondary | ICD-10-CM

## 2013-12-15 DIAGNOSIS — I480 Paroxysmal atrial fibrillation: Secondary | ICD-10-CM

## 2013-12-15 DIAGNOSIS — I4891 Unspecified atrial fibrillation: Secondary | ICD-10-CM

## 2013-12-15 DIAGNOSIS — Z5181 Encounter for therapeutic drug level monitoring: Secondary | ICD-10-CM

## 2013-12-15 LAB — POCT INR: INR: 2

## 2014-01-26 ENCOUNTER — Ambulatory Visit (INDEPENDENT_AMBULATORY_CARE_PROVIDER_SITE_OTHER): Payer: Medicare Other | Admitting: *Deleted

## 2014-01-26 DIAGNOSIS — I4891 Unspecified atrial fibrillation: Secondary | ICD-10-CM

## 2014-01-26 DIAGNOSIS — I480 Paroxysmal atrial fibrillation: Secondary | ICD-10-CM

## 2014-01-26 DIAGNOSIS — Z5181 Encounter for therapeutic drug level monitoring: Secondary | ICD-10-CM

## 2014-01-26 DIAGNOSIS — Z7901 Long term (current) use of anticoagulants: Secondary | ICD-10-CM

## 2014-01-26 LAB — POCT INR: INR: 2.6

## 2014-03-02 ENCOUNTER — Ambulatory Visit: Payer: Medicare Other | Admitting: Cardiology

## 2014-03-02 ENCOUNTER — Ambulatory Visit (INDEPENDENT_AMBULATORY_CARE_PROVIDER_SITE_OTHER): Payer: Medicare Other | Admitting: *Deleted

## 2014-03-02 ENCOUNTER — Encounter: Payer: Self-pay | Admitting: Cardiology

## 2014-03-02 ENCOUNTER — Ambulatory Visit (INDEPENDENT_AMBULATORY_CARE_PROVIDER_SITE_OTHER): Payer: Medicare Other | Admitting: Cardiology

## 2014-03-02 VITALS — BP 109/67 | HR 67 | Ht 72.0 in | Wt 217.0 lb

## 2014-03-02 DIAGNOSIS — I10 Essential (primary) hypertension: Secondary | ICD-10-CM

## 2014-03-02 DIAGNOSIS — I480 Paroxysmal atrial fibrillation: Secondary | ICD-10-CM

## 2014-03-02 DIAGNOSIS — Z7901 Long term (current) use of anticoagulants: Secondary | ICD-10-CM

## 2014-03-02 DIAGNOSIS — I4891 Unspecified atrial fibrillation: Secondary | ICD-10-CM

## 2014-03-02 DIAGNOSIS — Z5181 Encounter for therapeutic drug level monitoring: Secondary | ICD-10-CM

## 2014-03-02 LAB — POCT INR: INR: 2.3

## 2014-03-02 NOTE — Assessment & Plan Note (Signed)
Generally well-controlled on current regimen. Continue flecainide , Cardizem CD, and Coumadin. Follow-up in 6 months with ECG.

## 2014-03-02 NOTE — Progress Notes (Signed)
Reason for visit: Atrial fibrillation  Clinical Summary [name] is a 69 y.o.male last seen in July 2015.  He presents for a routine follow-up visit. Does report occasional palpitations, longest episode of presumed atrial fibrillation was about an hour. He has not required any changes in his medications. No bleeding episodes on Coumadin. ECG from July reviewed.  He just had follow-up lab work with Dr. Margo Common earlier today. Cholesterol 185, triglycerides 133, HDL 34, LDL 124, potassium 3.9 , AST 28, ALT 25, BUN 16, creatinine 0.7 , hemoglobin 15.7, platelets 180.  He remains active, enjoys fishing, also working in his yard. Ports NYHA class II dyspnea, no chest pain.   Allergies  Allergen Reactions  . Dust Mite Extract     Current Outpatient Prescriptions  Medication Sig Dispense Refill  . acetaminophen (TYLENOL) 325 MG tablet Take 650 mg by mouth as needed.      Marland Kitchen b complex vitamins tablet Take 1 tablet by mouth daily.    . cetirizine (ZYRTEC) 10 MG tablet Take 10 mg by mouth daily.      . flecainide (TAMBOCOR) 50 MG tablet Take 1 tablet (50 mg total) by mouth 2 (two) times daily. 180 tablet 3  . fluticasone (FLONASE) 50 MCG/ACT nasal spray Place 2 sprays into the nose daily.    . hydroxyurea (HYDREA) 500 MG capsule Take 500 mg by mouth 3 (three) times a week. May take with food to minimize GI side effects.     Marland Kitchen losartan (COZAAR) 100 MG tablet Take 100 mg by mouth daily.      . metFORMIN (GLUCOPHAGE) 500 MG tablet Take 500 mg by mouth daily.      . pravastatin (PRAVACHOL) 40 MG tablet Take 40 mg by mouth at bedtime.      . verapamil (CALAN-SR) 180 MG CR tablet Take 180 mg by mouth at bedtime.      Marland Kitchen warfarin (COUMADIN) 2.5 MG tablet TAKE 3 TABLETS (7.5MG ) ON SUNDAY, TUESDAY AND THURSDAY AND TAKE 2 TABLETS ( ) ALL OTHER DAYS 210 tablet 3   No current facility-administered medications for this visit.    Past Medical History  Diagnosis Date  . Atrial fibrillation    Ablation at [facility] 2009, Dr. Deno Lunger  . Diabetes mellitus type II   . Essential hypertension, benign   . ITP (idiopathic thrombocytopenic purpura) 2001  . Primary erythrocytosis     Social History [name] reports that he quit smoking about 10 years ago. His smoking use included Cigars and Cigarettes. He started smoking about 13 years ago. He has a .3 pack-year smoking history. He has never used smokeless tobacco. [name] reports that he drinks about 4.2 oz of alcohol per week.  Review of Systems Complete review of systems negative except as otherwise outlined in the clinical summary and also the following.  No orthopnea or PND. Stable appetite.  Physical Examination Filed Vitals:   03/02/14 1257  BP: 109/67  Pulse: 67   Filed Weights   03/02/14 1257  Weight: 217 lb (98.431 kg)    Well-nourished male, comfortable at rest.  HEENT: Conjunctiva and lids normal, oropharynx clear.  Neck: Supple, no carotid bruits, no elevated JVP.  Lungs: Clear to auscultation, nonlabored.  Cardiac: Regular rate and rhythm, no S3.  Skin: Warm and dry.  Extremities: No pitting edema. Distal pulses are full.    Problem List and Plan   Paroxysmal atrial fibrillation  Generally well-controlled on current regimen. Continue flecainide , Cardizem CD, and Coumadin.  Follow-up in 6 months with ECG.  Essential hypertension, benign Blood pressure is normal today. Keep follow-up with Dr. Margo Commonapper.    Jonelle SidleSamuel G. McDowell, M.D., F.A.C.C.

## 2014-03-02 NOTE — Patient Instructions (Signed)

## 2014-03-02 NOTE — Assessment & Plan Note (Signed)
Blood pressure is normal today. Keep follow-up with Dr. Margo Commonapper.

## 2014-04-20 ENCOUNTER — Ambulatory Visit (INDEPENDENT_AMBULATORY_CARE_PROVIDER_SITE_OTHER): Payer: Medicare Other | Admitting: *Deleted

## 2014-04-20 DIAGNOSIS — Z7901 Long term (current) use of anticoagulants: Secondary | ICD-10-CM

## 2014-04-20 DIAGNOSIS — I4891 Unspecified atrial fibrillation: Secondary | ICD-10-CM

## 2014-04-20 DIAGNOSIS — I480 Paroxysmal atrial fibrillation: Secondary | ICD-10-CM

## 2014-04-20 DIAGNOSIS — Z5181 Encounter for therapeutic drug level monitoring: Secondary | ICD-10-CM

## 2014-04-20 LAB — POCT INR: INR: 3

## 2014-06-08 ENCOUNTER — Telehealth: Payer: Self-pay | Admitting: Cardiology

## 2014-06-08 ENCOUNTER — Ambulatory Visit (INDEPENDENT_AMBULATORY_CARE_PROVIDER_SITE_OTHER): Payer: Medicare Other | Admitting: *Deleted

## 2014-06-08 DIAGNOSIS — Z5181 Encounter for therapeutic drug level monitoring: Secondary | ICD-10-CM

## 2014-06-08 DIAGNOSIS — I4891 Unspecified atrial fibrillation: Secondary | ICD-10-CM

## 2014-06-08 DIAGNOSIS — I480 Paroxysmal atrial fibrillation: Secondary | ICD-10-CM

## 2014-06-08 DIAGNOSIS — Z7901 Long term (current) use of anticoagulants: Secondary | ICD-10-CM | POA: Diagnosis not present

## 2014-06-08 LAB — POCT INR: INR: 2.6

## 2014-06-08 MED ORDER — FLECAINIDE ACETATE 50 MG PO TABS: 50.0000 mg | ORAL_TABLET | Freq: Two times a day (BID) | ORAL | Status: DC

## 2014-06-08 NOTE — Telephone Encounter (Signed)
Medication sent to pharmacy  

## 2014-06-08 NOTE — Telephone Encounter (Signed)
flecainide (TAMBOCOR) 50 MG tablet Needs a refill sent to [facility]

## 2014-07-20 ENCOUNTER — Ambulatory Visit (INDEPENDENT_AMBULATORY_CARE_PROVIDER_SITE_OTHER): Payer: Medicare Other | Admitting: *Deleted

## 2014-07-20 DIAGNOSIS — I4891 Unspecified atrial fibrillation: Secondary | ICD-10-CM

## 2014-07-20 DIAGNOSIS — Z5181 Encounter for therapeutic drug level monitoring: Secondary | ICD-10-CM | POA: Diagnosis not present

## 2014-07-20 DIAGNOSIS — Z7901 Long term (current) use of anticoagulants: Secondary | ICD-10-CM | POA: Diagnosis not present

## 2014-07-20 DIAGNOSIS — I480 Paroxysmal atrial fibrillation: Secondary | ICD-10-CM

## 2014-07-20 LAB — POCT INR: INR: 2.7

## 2014-07-20 MED ORDER — WARFARIN SODIUM 2.5 MG PO TABS: ORAL_TABLET | ORAL | Status: DC

## 2014-08-31 ENCOUNTER — Ambulatory Visit (INDEPENDENT_AMBULATORY_CARE_PROVIDER_SITE_OTHER): Payer: Medicare Other | Admitting: *Deleted

## 2014-08-31 DIAGNOSIS — I4891 Unspecified atrial fibrillation: Secondary | ICD-10-CM | POA: Diagnosis not present

## 2014-08-31 DIAGNOSIS — Z7901 Long term (current) use of anticoagulants: Secondary | ICD-10-CM | POA: Diagnosis not present

## 2014-08-31 DIAGNOSIS — I480 Paroxysmal atrial fibrillation: Secondary | ICD-10-CM

## 2014-08-31 DIAGNOSIS — Z5181 Encounter for therapeutic drug level monitoring: Secondary | ICD-10-CM

## 2014-08-31 LAB — POCT INR: INR: 2.6

## 2014-10-01 ENCOUNTER — Encounter: Payer: Self-pay | Admitting: *Deleted

## 2014-10-01 ENCOUNTER — Encounter: Payer: Self-pay | Admitting: Cardiology

## 2014-10-01 ENCOUNTER — Ambulatory Visit (INDEPENDENT_AMBULATORY_CARE_PROVIDER_SITE_OTHER): Payer: Medicare Other | Admitting: Cardiology

## 2014-10-01 VITALS — BP 116/72 | HR 72 | Ht 72.0 in | Wt 218.4 lb

## 2014-10-01 DIAGNOSIS — I480 Paroxysmal atrial fibrillation: Secondary | ICD-10-CM | POA: Diagnosis not present

## 2014-10-01 DIAGNOSIS — I10 Essential (primary) hypertension: Secondary | ICD-10-CM

## 2014-10-01 NOTE — Patient Instructions (Signed)
Your physician recommends that you continue on your current medications as directed. Please refer to the Current Medication list given to you today. Your physician recommends that you schedule a follow-up appointment in: 6 months. You will receive a reminder letter in the mail in about 4 months reminding you to call and schedule your appointment. If you don't receive this letter, please contact our office. 

## 2014-10-01 NOTE — Progress Notes (Signed)
Cardiology Office Note  Date: 10/01/2014   ID: [name], DOB 01-15-1946, MRN [id number]  PCP: Richard Boston, MD  Primary Cardiologist: Richard Dell, MD   Chief Complaint  Patient presents with  . Atrial Fibrillation    History of Present Illness: [name] is a 69 y.o. male last seen in January. He presents for a routine follow-up visit. Since last encounter he reports no significant progression and palpitations. Medications are also stable as outlined below. He continues to follow with Dr. Margo Common, had lab work recently.  He continues on Coumadin, followed in the anticoagulation clinic. No reported bleeding problems.  Follow-up ECG today shows sinus rhythm with prolonged PR interval and PACs, QRS 102 ms.   Past Medical History  Diagnosis Date  . Atrial fibrillation     Ablation at [facility] 2009, Dr. Deno Lunger  . Diabetes mellitus type II   . Essential hypertension, benign   . ITP (idiopathic thrombocytopenic purpura) 2001  . Primary erythrocytosis     Current Outpatient Prescriptions  Medication Sig Dispense Refill  . acetaminophen (TYLENOL) 325 MG tablet Take 650 mg by mouth as needed.      Marland Kitchen b complex vitamins tablet Take 1 tablet by mouth daily.    . cetirizine (ZYRTEC) 10 MG tablet Take 10 mg by mouth daily.      . flecainide (TAMBOCOR) 50 MG tablet Take 1 tablet (50 mg total) by mouth 2 (two) times daily. 180 tablet 3  . fluticasone (FLONASE) 50 MCG/ACT nasal spray Place 2 sprays into the nose daily.    . hydroxyurea (HYDREA) 500 MG capsule Take 500 mg by mouth 3 (three) times a week. May take with food to minimize GI side effects.     Marland Kitchen losartan (COZAAR) 100 MG tablet Take 100 mg by mouth daily.      . metFORMIN (GLUCOPHAGE) 500 MG tablet Take 500 mg by mouth daily.      . pravastatin (PRAVACHOL) 40 MG tablet Take 40 mg by mouth at bedtime.      . verapamil (CALAN-SR) 180 MG CR tablet Take 180 mg by mouth at bedtime.      Marland Kitchen warfarin (COUMADIN)  2.5 MG tablet TAKE 3 TABLETS (7.5MG ) ON SUNDAY, TUESDAY AND THURSDAY AND TAKE 2 TABLETS (5MG ) ALL OTHER DAYS 210 tablet 3   No current facility-administered medications for this visit.    Allergies:  Dust mite extract   Social History: The patient  reports that he quit smoking about 10 years ago. His smoking use included Cigars and Cigarettes. He started smoking about 13 years ago. He has a .3 pack-year smoking history. He has never used smokeless tobacco. He reports that he drinks about 4.2 oz of alcohol per week. He reports that he does not use illicit drugs.   ROS:  Please see the history of present illness. Otherwise, complete review of systems is positive for none.  All other systems are reviewed and negative.   Physical Exam: VS:  BP 116/72 mmHg  Pulse 72  Ht 6' (1.829 m)  Wt 218 lb 6.4 oz (99.066 kg)  BMI 29.61 kg/m2  SpO2 93%, BMI Body mass index is 29.61 kg/(m^2).  Wt Readings from Last 3 Encounters:  10/01/14 218 lb 6.4 oz (99.066 kg)  03/02/14 217 lb (98.431 kg)  09/11/13 214 lb 1.9 oz (97.124 kg)    Well-nourished male, comfortable at rest.  HEENT: Conjunctiva and lids normal, oropharynx clear.  Neck: Supple, no carotid bruits, no elevated  JVP.  Lungs: Clear to auscultation, nonlabored.  Cardiac: Regular rate and rhythm, no S3.  Skin: Warm and dry.  Extremities: No pitting edema. Distal pulses are full.    ECG: ECG is ordered today.  Assessment and Plan:  1. Paroxysmal atrial fibrillation, symptomatically well controlled at this time. He is status post ablation at [facility] in 2009 and continues on antiarrhythmic therapy as well. Keep follow-up in the anticoagulation clinic.  2. Essential hypertension, blood pressure is normal today.  Current medicines were reviewed with the patient today.   Orders Placed This Encounter  Procedures  . EKG 12-Lead    Disposition: FU with mer in 6 months.   Signed, Jonelle Sidle, MD, [facility] 10/01/2014 9:22 AM    [facility] [address] [place], [place], [place] [address] Phone: [phone]; Fax: [phone]

## 2014-10-07 ENCOUNTER — Ambulatory Visit (INDEPENDENT_AMBULATORY_CARE_PROVIDER_SITE_OTHER): Payer: Medicare Other | Admitting: *Deleted

## 2014-10-07 DIAGNOSIS — I480 Paroxysmal atrial fibrillation: Secondary | ICD-10-CM | POA: Diagnosis not present

## 2014-10-07 DIAGNOSIS — Z7901 Long term (current) use of anticoagulants: Secondary | ICD-10-CM | POA: Diagnosis not present

## 2014-10-07 DIAGNOSIS — I4891 Unspecified atrial fibrillation: Secondary | ICD-10-CM | POA: Diagnosis not present

## 2014-10-07 DIAGNOSIS — Z5181 Encounter for therapeutic drug level monitoring: Secondary | ICD-10-CM

## 2014-10-07 LAB — POCT INR: INR: 2.6

## 2014-11-23 ENCOUNTER — Ambulatory Visit (INDEPENDENT_AMBULATORY_CARE_PROVIDER_SITE_OTHER): Payer: Medicare Other | Admitting: *Deleted

## 2014-11-23 DIAGNOSIS — I4891 Unspecified atrial fibrillation: Secondary | ICD-10-CM

## 2014-11-23 DIAGNOSIS — I480 Paroxysmal atrial fibrillation: Secondary | ICD-10-CM

## 2014-11-23 DIAGNOSIS — Z7901 Long term (current) use of anticoagulants: Secondary | ICD-10-CM

## 2014-11-23 DIAGNOSIS — Z5181 Encounter for therapeutic drug level monitoring: Secondary | ICD-10-CM | POA: Diagnosis not present

## 2014-11-23 LAB — POCT INR: INR: 2.3

## 2015-01-04 ENCOUNTER — Ambulatory Visit (INDEPENDENT_AMBULATORY_CARE_PROVIDER_SITE_OTHER): Payer: Medicare Other | Admitting: *Deleted

## 2015-01-04 DIAGNOSIS — I480 Paroxysmal atrial fibrillation: Secondary | ICD-10-CM

## 2015-01-04 DIAGNOSIS — Z7901 Long term (current) use of anticoagulants: Secondary | ICD-10-CM

## 2015-01-04 DIAGNOSIS — I4891 Unspecified atrial fibrillation: Secondary | ICD-10-CM | POA: Diagnosis not present

## 2015-01-04 DIAGNOSIS — Z5181 Encounter for therapeutic drug level monitoring: Secondary | ICD-10-CM | POA: Diagnosis not present

## 2015-01-04 LAB — POCT INR: INR: 2.8

## 2015-02-15 ENCOUNTER — Ambulatory Visit (INDEPENDENT_AMBULATORY_CARE_PROVIDER_SITE_OTHER): Payer: Medicare Other | Admitting: *Deleted

## 2015-02-15 DIAGNOSIS — Z5181 Encounter for therapeutic drug level monitoring: Secondary | ICD-10-CM

## 2015-02-15 DIAGNOSIS — Z7901 Long term (current) use of anticoagulants: Secondary | ICD-10-CM | POA: Diagnosis not present

## 2015-02-15 DIAGNOSIS — I4891 Unspecified atrial fibrillation: Secondary | ICD-10-CM | POA: Diagnosis not present

## 2015-02-15 DIAGNOSIS — I480 Paroxysmal atrial fibrillation: Secondary | ICD-10-CM | POA: Diagnosis not present

## 2015-02-15 LAB — POCT INR: INR: 2.2

## 2015-03-02 ENCOUNTER — Other Ambulatory Visit: Payer: Self-pay | Admitting: Cardiology

## 2015-03-02 MED ORDER — FLECAINIDE ACETATE 50 MG PO TABS: 50.0000 mg | ORAL_TABLET | Freq: Two times a day (BID) | ORAL | Status: DC

## 2015-03-02 NOTE — Telephone Encounter (Signed)
°  1. Which medications need to be refilled? (please list name of each medication and dose if known)  flecainide (TAMBOCOR) 50 MG tablet [[id number]][[id number]]   Warfarin 2.5         2. Which pharmacy/location (including street and city if local pharmacy) is medication to be sent to?[facility]   3. Do they need a 30 day or 90 day supply? 90

## 2015-03-29 ENCOUNTER — Ambulatory Visit (INDEPENDENT_AMBULATORY_CARE_PROVIDER_SITE_OTHER): Payer: Medicare Other | Admitting: *Deleted

## 2015-03-29 DIAGNOSIS — Z7901 Long term (current) use of anticoagulants: Secondary | ICD-10-CM

## 2015-03-29 DIAGNOSIS — Z5181 Encounter for therapeutic drug level monitoring: Secondary | ICD-10-CM | POA: Diagnosis not present

## 2015-03-29 DIAGNOSIS — I4891 Unspecified atrial fibrillation: Secondary | ICD-10-CM

## 2015-03-29 DIAGNOSIS — I480 Paroxysmal atrial fibrillation: Secondary | ICD-10-CM

## 2015-03-29 LAB — POCT INR: INR: 2.5

## 2015-04-04 ENCOUNTER — Encounter: Payer: Self-pay | Admitting: *Deleted

## 2015-04-04 ENCOUNTER — Encounter: Payer: Self-pay | Admitting: Cardiology

## 2015-04-04 ENCOUNTER — Ambulatory Visit (INDEPENDENT_AMBULATORY_CARE_PROVIDER_SITE_OTHER): Payer: Medicare Other | Admitting: Cardiology

## 2015-04-04 VITALS — BP 122/76 | HR 66 | Ht 72.0 in | Wt 224.0 lb

## 2015-04-04 DIAGNOSIS — I480 Paroxysmal atrial fibrillation: Secondary | ICD-10-CM | POA: Diagnosis not present

## 2015-04-04 DIAGNOSIS — Z5181 Encounter for therapeutic drug level monitoring: Secondary | ICD-10-CM | POA: Diagnosis not present

## 2015-04-04 DIAGNOSIS — R0600 Dyspnea, unspecified: Secondary | ICD-10-CM

## 2015-04-04 DIAGNOSIS — Z79899 Other long term (current) drug therapy: Secondary | ICD-10-CM

## 2015-04-04 DIAGNOSIS — I10 Essential (primary) hypertension: Secondary | ICD-10-CM | POA: Diagnosis not present

## 2015-04-04 NOTE — Progress Notes (Signed)
Cardiology Office Note  Date: 04/04/2015   ID: MATHIAS BOGACKI, DOB [birth year], MRN [id number]  PCP: Louie Boston, MD  Primary Cardiologist: Nona Dell, MD   Chief Complaint  Patient presents with  . Atrial Fibrillation    History of Present Illness: [name] is a 70 y.o. male last seen in August 2016. He presents for a routine follow-up visit. He reports having a few breakthrough episodes of atrial fibrillation back in November 2016, none since that time. He is not endorse any exertional chest pain, does describe dyspnea on exertion if he really pushes himself, but still does his usual walking. He has been limited by some arthritic knee pain.  We reviewed his medications which are outlined below. Cardiac regimen includes flecainide, verapamil, and Coumadin. He continues with follow-up in the anticoagulation clinic. Has had no bleeding problems.  ECG today shows sinus rhythm with prolonged PR interval 240 ms, QRS duration 100 ms, borderline low voltage.  He reports having lab work with Dr. Margo Common just recently in January, we will request the results.  Past Medical History  Diagnosis Date  . Atrial fibrillation [facility])     Ablation at [facility] 2009, Dr. Deno Lunger  . Diabetes mellitus type II   . Essential hypertension, benign   . ITP (idiopathic thrombocytopenic purpura) 2001  . Primary erythrocytosis (HCC)     Current Outpatient Prescriptions  Medication Sig Dispense Refill  . acetaminophen (TYLENOL) 325 MG tablet Take 650 mg by mouth as needed.      Marland Kitchen b complex vitamins tablet Take 1 tablet by mouth daily.    . cetirizine (ZYRTEC) 10 MG tablet Take 10 mg by mouth daily.      . flecainide (TAMBOCOR) 50 MG tablet Take 1 tablet (50 mg total) by mouth 2 (two) times daily. 180 tablet 3  . fluticasone (FLONASE) 50 MCG/ACT nasal spray Place 2 sprays into the nose daily.    . hydroxyurea (HYDREA) 500 MG capsule Take 500 mg by mouth 3 (three) times a week. May take  with food to minimize GI side effects.     Marland Kitchen losartan (COZAAR) 100 MG tablet Take 100 mg by mouth daily.      . metFORMIN (GLUCOPHAGE) 500 MG tablet Take 500 mg by mouth daily.      . pravastatin (PRAVACHOL) 40 MG tablet Take 40 mg by mouth at bedtime.      . verapamil (CALAN-SR) 180 MG CR tablet Take 180 mg by mouth at bedtime.      Marland Kitchen warfarin (COUMADIN) 2.5 MG tablet TAKE 3 TABLETS (7.5MG ) ON SUNDAY, TUESDAY AND THURSDAY AND TAKE 2 TABLETS ( ) ALL OTHER DAYS 210 tablet 3   No current facility-administered medications for this visit.   Allergies:  Dust mite extract   Social History: The patient  reports that he quit smoking about 11 years ago. His smoking use included Cigars and Cigarettes. He started smoking about 14 years ago. He has a .3 pack-year smoking history. He has never used smokeless tobacco. He reports that he drinks about 4.2 oz of alcohol per week. He reports that he does not use illicit drugs.   ROS:  Please see the history of present illness. Otherwise, complete review of systems is positive for arthritic left knee pain.  All other systems are reviewed and negative.   Physical Exam: VS:  BP 122/76 mmHg  Pulse 66  Ht 6' (1.829 m)  Wt 224 lb (101.606 kg)  BMI 30.37 kg/m2  SpO2  96%, BMI Body mass index is 30.37 kg/(m^2).  Wt Readings from Last 3 Encounters:  04/04/15 224 lb (101.606 kg)  10/01/14 218 lb 6.4 oz (99.066 kg)  03/02/14 217 lb (98.431 kg)    Appears comfortable at rest.  HEENT: Conjunctiva and lids normal, oropharynx clear.  Neck: Supple, no carotid bruits, no elevated JVP.  Lungs: Clear to auscultation, nonlabored.  Cardiac: Regular rate and rhythm, no S3.  Skin: Warm and dry.  Extremities: No pitting edema. Distal pulses are full.   ECG:  I reviewed his previous tracing from 10/01/2014 which showed sinus rhythm with prolonged PR interval of 230 ms, PACs, borderline low voltage, QRS duration 102 ms.  Recent Labwork:  January 2016: cholesterol  185, triglycerides 133, HDL 34, LDL 124, hemoglobin A1c 5.8, BUN 16, creatinine 0.7, AST 28, ALT 25, potassium 3.9, hemoglobin 15.7, platelets 180  Assessment and Plan:  1. Paroxysmal atrial fibrillation, symptomatically stable on current regimen which includes flecainide, verapamil, and Coumadin. ECG reviewed above. We discussed obtaining a follow-up GXT (hold verapamil) as a general screen for ischemic heart disease with plan to continue flecainide long-term.  2. Essential hypertension, blood pressure control is good today. No changes made.  Current medicines were reviewed with the patient today.   Orders Placed This Encounter  Procedures  . Exercise Tolerance Test  . EKG 12-Lead    Disposition: FU with me in 6 months.   Signed, Jonelle Sidle, MD, [facility] 04/04/2015 8:48 AM    [facility] [address] [place], [place], [place] [address] Phone: [phone]; Fax: [phone]

## 2015-04-04 NOTE — Patient Instructions (Signed)
Your physician recommends that you continue on your current medications as directed. Please refer to the Current Medication list given to you today. Your physician has requested that you have an exercise tolerance test. For further information please visit [email or web address] Please also follow instruction sheet, as given. Your physician recommends that you schedule a follow-up appointment in: 6 months. You will receive a reminder letter in the mail in about 4 months reminding you to call and schedule your appointment. If you don't receive this letter, please contact our office.

## 2015-04-06 ENCOUNTER — Ambulatory Visit (HOSPITAL_COMMUNITY)
Admission: RE | Admit: 2015-04-06 | Discharge: 2015-04-06 | Disposition: A | Discharge status: Home / Self Care | Payer: Medicare Other | Source: Ambulatory Visit | Attending: Cardiology | Admitting: Cardiology

## 2015-04-06 DIAGNOSIS — R0600 Dyspnea, unspecified: Secondary | ICD-10-CM | POA: Diagnosis not present

## 2015-04-06 DIAGNOSIS — Z79899 Other long term (current) drug therapy: Secondary | ICD-10-CM | POA: Diagnosis not present

## 2015-04-06 DIAGNOSIS — Z5181 Encounter for therapeutic drug level monitoring: Secondary | ICD-10-CM | POA: Diagnosis not present

## 2015-04-06 LAB — EXERCISE TOLERANCE TEST
CHL RATE OF PERCEIVED EXERTION: 13
CSEPED: 9 min
CSEPEDS: 2 s
CSEPEW: 10.4 METS
CSEPHR: 85 %
MPHR: 151 {beats}/min
Peak HR: 127 {beats}/min
Rest HR: 66 {beats}/min

## 2015-04-07 ENCOUNTER — Encounter: Payer: Self-pay | Admitting: *Deleted

## 2015-05-10 ENCOUNTER — Ambulatory Visit (INDEPENDENT_AMBULATORY_CARE_PROVIDER_SITE_OTHER): Payer: Medicare Other | Admitting: *Deleted

## 2015-05-10 DIAGNOSIS — Z5181 Encounter for therapeutic drug level monitoring: Secondary | ICD-10-CM

## 2015-05-10 DIAGNOSIS — I480 Paroxysmal atrial fibrillation: Secondary | ICD-10-CM

## 2015-05-10 LAB — POCT INR: INR: 2.1

## 2015-05-10 MED ORDER — WARFARIN SODIUM 2.5 MG PO TABS: ORAL_TABLET | ORAL | Status: DC

## 2015-05-26 ENCOUNTER — Encounter (HOSPITAL_COMMUNITY): Payer: Self-pay

## 2015-05-26 ENCOUNTER — Emergency Department (HOSPITAL_COMMUNITY): Payer: Medicare Other

## 2015-05-26 ENCOUNTER — Emergency Department (HOSPITAL_COMMUNITY)
Admission: EM | Admit: 2015-05-26 | Discharge: 2015-05-26 | Disposition: A | Discharge status: Home / Self Care | Payer: Medicare Other | Attending: Emergency Medicine | Admitting: Emergency Medicine

## 2015-05-26 DIAGNOSIS — Y9389 Activity, other specified: Secondary | ICD-10-CM | POA: Diagnosis not present

## 2015-05-26 DIAGNOSIS — Y999 Unspecified external cause status: Secondary | ICD-10-CM | POA: Insufficient documentation

## 2015-05-26 DIAGNOSIS — I4891 Unspecified atrial fibrillation: Secondary | ICD-10-CM | POA: Insufficient documentation

## 2015-05-26 DIAGNOSIS — E119 Type 2 diabetes mellitus without complications: Secondary | ICD-10-CM | POA: Insufficient documentation

## 2015-05-26 DIAGNOSIS — M795 Residual foreign body in soft tissue: Secondary | ICD-10-CM

## 2015-05-26 DIAGNOSIS — W208XXA Other cause of strike by thrown, projected or falling object, initial encounter: Secondary | ICD-10-CM | POA: Insufficient documentation

## 2015-05-26 DIAGNOSIS — Z7901 Long term (current) use of anticoagulants: Secondary | ICD-10-CM | POA: Insufficient documentation

## 2015-05-26 DIAGNOSIS — Z862 Personal history of diseases of the blood and blood-forming organs and certain disorders involving the immune mechanism: Secondary | ICD-10-CM | POA: Diagnosis not present

## 2015-05-26 DIAGNOSIS — Z7984 Long term (current) use of oral hypoglycemic drugs: Secondary | ICD-10-CM | POA: Diagnosis not present

## 2015-05-26 DIAGNOSIS — S6992XA Unspecified injury of left wrist, hand and finger(s), initial encounter: Secondary | ICD-10-CM | POA: Diagnosis present

## 2015-05-26 DIAGNOSIS — I10 Essential (primary) hypertension: Secondary | ICD-10-CM | POA: Insufficient documentation

## 2015-05-26 DIAGNOSIS — Z7951 Long term (current) use of inhaled steroids: Secondary | ICD-10-CM | POA: Diagnosis not present

## 2015-05-26 DIAGNOSIS — Z87891 Personal history of nicotine dependence: Secondary | ICD-10-CM | POA: Diagnosis not present

## 2015-05-26 DIAGNOSIS — Y9289 Other specified places as the place of occurrence of the external cause: Secondary | ICD-10-CM | POA: Insufficient documentation

## 2015-05-26 DIAGNOSIS — Z79899 Other long term (current) drug therapy: Secondary | ICD-10-CM | POA: Insufficient documentation

## 2015-05-26 DIAGNOSIS — S60352A Superficial foreign body of left thumb, initial encounter: Secondary | ICD-10-CM | POA: Diagnosis not present

## 2015-05-26 MED ORDER — HYDROCODONE-ACETAMINOPHEN 5-325 MG PO TABS: 1.0000 | ORAL_TABLET | Freq: Four times a day (QID) | ORAL | Status: AC | PRN

## 2015-05-26 NOTE — ED Provider Notes (Signed)
CSN: [id number]     Arrival date & time 05/26/15  1600 History  By signing my name below, I, Tanda Rockers, attest that this documentation has been prepared under the direction and in the presence of Felicie Morn, NP. Electronically Signed: Tanda Rockers, ED Scribe. 05/26/2015. 4:34 PM.   Chief Complaint  Patient presents with  . Hand Injury   Patient is a 70 y.o. male presenting with hand injury. The history is provided by the patient. No language interpreter was used.  Hand Injury Associated symptoms: no fever      HPI Comments: [name] is a 70 y.o. male with PMHx atrial fibrillation, DM, HTN who presents to the Emergency Department complaining of sudden onset, constant, left thumb pain that began last night after a piece of metal lodged into pt's thumb. Pt reports that he was hammering last night when a piece of the head of the hammer fell off and lodged into his thumb, causing the pain. Pt was seen at [facility] this morning who attempted to remove the foreign body unsuccessfully and sent pt to the ED for further evaluation. He denies any other associated symptoms. Pt's tetanus was updated today at [facility].   Past Medical History  Diagnosis Date  . Atrial fibrillation [facility])     Ablation at [facility] 2009, Dr. Deno Lunger  . Diabetes mellitus type II   . Essential hypertension, benign   . ITP (idiopathic thrombocytopenic purpura) 2001  . Primary erythrocytosis [facility])    Past Surgical History  Procedure Laterality Date  . Rotator cuff repair    . Splenectomy, total  2001  . Tonsillectomy    . Hernia repair     Family History  Problem Relation Age of Onset  . Coronary artery disease Father    Social History  Substance Use Topics  . Smoking status: Former Smoker -- 0.10 packs/day for 3 years    Types: Cigars, Cigarettes    Start date: 02/15/2001    Quit date: 02/16/2004  . Smokeless tobacco: Never Used  . Alcohol Use: 4.2 oz/week    7 Glasses of wine per week     Comment:  occasional beer     Review of Systems  Constitutional: Negative for fever.  Musculoskeletal: Positive for arthralgias (left thumb.).  Skin: Positive for wound.  All other systems reviewed and are negative.  Allergies  Dust mite extract  Home Medications   Prior to Admission medications   Medication Sig Start Date End Date Taking? Authorizing Provider  acetaminophen (TYLENOL) 325 MG tablet Take 650 mg by mouth as needed.      Historical Provider, MD  b complex vitamins tablet Take 1 tablet by mouth daily.    Historical Provider, MD  cetirizine (ZYRTEC) 10 MG tablet Take 10 mg by mouth daily.      Historical Provider, MD  flecainide (TAMBOCOR) 50 MG tablet Take 1 tablet (50 mg total) by mouth 2 (two) times daily. 03/02/15   Jonelle Sidle, MD  fluticasone (FLONASE) 50 MCG/ACT nasal spray Place 2 sprays into the nose daily.    Historical Provider, MD  hydroxyurea (HYDREA) 500 MG capsule Take 500 mg by mouth 3 (three) times a week. May take with food to minimize GI side effects.     Historical Provider, MD  losartan (COZAAR) 100 MG tablet Take 100 mg by mouth daily.      Historical Provider, MD  metFORMIN (GLUCOPHAGE) 500 MG tablet Take 500 mg by mouth daily.  Historical Provider, MD  pravastatin (PRAVACHOL) 40 MG tablet Take 40 mg by mouth at bedtime.      Historical Provider, MD  verapamil (CALAN-SR) 180 MG CR tablet Take 180 mg by mouth at bedtime.      Historical Provider, MD  warfarin (COUMADIN) 2.5 MG tablet Take as directed by coumadin clionic 05/10/15   Jonelle SidleSamuel G McDowell, MD   BP 146/85 mmHg  Pulse 76  Temp(Src) 98.2 F (36.8 C) (Oral)  Resp 18  SpO2 98%   Physical Exam  Constitutional: He is oriented to person, place, and time. He appears well-developed and well-nourished. No distress.  HENT:  Head: Normocephalic and atraumatic.  Eyes: Conjunctivae and EOM are normal.  Neck: Neck supple. No tracheal deviation present.  Cardiovascular: Normal rate.   Pulmonary/Chest:  Effort normal. No respiratory distress.  Musculoskeletal: Normal range of motion.  Surgical wound over the MCP of left thumb that has been closed with 3 stitches by prior provider. Distal sensation and brisk capillary refill intact.  Neurological: He is alert and oriented to person, place, and time.  Skin: Skin is warm and dry.  Psychiatric: He has a normal mood and affect. His behavior is normal.  Nursing note and vitals reviewed.   ED Course  Procedures (including critical care time)  DIAGNOSTIC STUDIES: Oxygen Saturation is 98% on RA, normal by my interpretation.    COORDINATION OF CARE: 4:33 PM-Discussed treatment plan which includes consult hand specialist with pt at bedside and pt agreed to plan.   Labs Review Labs Reviewed - No data to display  Imaging Review Dg Finger Thumb Left  05/26/2015  CLINICAL DATA:  Probable metallic foreign body EXAM: LEFT THUMB 2+V COMPARISON:  05/26/2015 FINDINGS: Three views of the left thumb submitted. Persistent metallic foreign body dorsal aspect at the base of proximal phalanx adjacent to first metacarpal phalangeal joint. Measures about 3.2 mm. IMPRESSION: Persistent metallic foreign body dorsal aspect at the base of proximal phalanx adjacent to first metacarpal phalangeal joint. Electronically Signed   By: Natasha MeadLiviu  Pop M.D.   On: 05/26/2015 16:30   I have personally reviewed and evaluated these images as part of my medical decision-making.   EKG Interpretation None     Patient reports driving stakes in the ground last night when part of the head of the hammer broke off, striking him in the left thumb.  He was seen at an urgent care in [place] earlier today, where a small metallic fragment was noted to be embedded in his left thumb.  The provider had attempted removal with a longitudinal incision, but was not able to extract the fragment.  Patient subsequently sent to the ED for additional evaluation.  Xray confirms a retained fragment.  Consult  with hand (Coley)--will see patient in the office tomorrow morning at 0830.  Patient with history of diabetes--he has already been started on augmentin. MDM   Final diagnoses:  None  Retained foreign body. Care instructions provided. Follow-up with Dr Izora Ribasoley in the morning.  I personally performed the services described in this documentation, which was scribed in my presence. The recorded information has been reviewed and is accurate.      Felicie Mornavid Ranay Ketter, NP 05/26/15 1854  Laurence Spatesachel Morgan Little, MD 05/27/15 [phone]

## 2015-05-26 NOTE — Discharge Instructions (Signed)
Report to Dr. Debby Budoley's office at 0830 on 05/27/15. Take antibiotic as directed. Limit oral intake 8 hours prior to your appointment.

## 2015-05-26 NOTE — ED Notes (Signed)
Patient here with left thumb pain after piece of metal stuck in same. Was seen at [facility] and they attempted to remove without success.

## 2015-06-21 ENCOUNTER — Ambulatory Visit (INDEPENDENT_AMBULATORY_CARE_PROVIDER_SITE_OTHER): Payer: Medicare Other | Admitting: *Deleted

## 2015-06-21 DIAGNOSIS — Z5181 Encounter for therapeutic drug level monitoring: Secondary | ICD-10-CM

## 2015-06-21 DIAGNOSIS — Z7901 Long term (current) use of anticoagulants: Secondary | ICD-10-CM

## 2015-06-21 DIAGNOSIS — I480 Paroxysmal atrial fibrillation: Secondary | ICD-10-CM

## 2015-06-21 DIAGNOSIS — I4891 Unspecified atrial fibrillation: Secondary | ICD-10-CM | POA: Diagnosis not present

## 2015-06-21 LAB — POCT INR: INR: 2.2

## 2015-07-28 ENCOUNTER — Ambulatory Visit (INDEPENDENT_AMBULATORY_CARE_PROVIDER_SITE_OTHER): Payer: Medicare Other | Admitting: *Deleted

## 2015-07-28 DIAGNOSIS — I480 Paroxysmal atrial fibrillation: Secondary | ICD-10-CM | POA: Diagnosis not present

## 2015-07-28 DIAGNOSIS — Z7901 Long term (current) use of anticoagulants: Secondary | ICD-10-CM | POA: Diagnosis not present

## 2015-07-28 DIAGNOSIS — I4891 Unspecified atrial fibrillation: Secondary | ICD-10-CM

## 2015-07-28 DIAGNOSIS — Z5181 Encounter for therapeutic drug level monitoring: Secondary | ICD-10-CM

## 2015-07-28 LAB — POCT INR: INR: 2.3

## 2015-09-13 ENCOUNTER — Ambulatory Visit (INDEPENDENT_AMBULATORY_CARE_PROVIDER_SITE_OTHER): Payer: Medicare Other | Admitting: *Deleted

## 2015-09-13 DIAGNOSIS — Z5181 Encounter for therapeutic drug level monitoring: Secondary | ICD-10-CM

## 2015-09-13 DIAGNOSIS — Z7901 Long term (current) use of anticoagulants: Secondary | ICD-10-CM | POA: Diagnosis not present

## 2015-09-13 DIAGNOSIS — I4891 Unspecified atrial fibrillation: Secondary | ICD-10-CM

## 2015-09-13 DIAGNOSIS — I480 Paroxysmal atrial fibrillation: Secondary | ICD-10-CM | POA: Diagnosis not present

## 2015-09-13 LAB — POCT INR: INR: 2

## 2015-09-28 NOTE — Progress Notes (Signed)
Cardiology Office Note  Date: 10/10/2015   ID: DAIGAN MUDD, DOB 04-Dec-1945, MRN [id number]  PCP: Louie Boston, MD  Primary Cardiologist: Nona Dell, MD   Chief Complaint  Patient presents with  . PAF    History of Present Illness: [name] is a 70 y.o. male last seen in February. He presents for a routine follow-up visit. Reports stable, occasional episodes of fatigue that he attributes to possible breakthrough atrial fibrillation, although no significant sense of palpitations as he had felt in the past. I reviewed his follow-up tracing today which shows sinus rhythm with prolonged PR interval and occasional PACs, QRS duration 90 ms, and normal QTC.  Follow-up GXT from February was reassuring as outlined below.  He continues on Coumadin with follow-up in the anticoagulation clinic. No bleeding episodes reported. He reports compliance with his remaining medications and has been on stable low-dose flecainide for some time now.  He tells me that he recently got back from traveling to [place], his older brother passed away suddenly after an injury.  Past Medical History:  Diagnosis Date  . Atrial fibrillation [facility])    Ablation at [facility] 2009, Dr. Deno Lunger  . Diabetes mellitus type II   . Essential hypertension, benign   . ITP (idiopathic thrombocytopenic purpura) 2001  . Primary erythrocytosis (HCC)     Current Outpatient Prescriptions  Medication Sig Dispense Refill  . acetaminophen (TYLENOL) 325 MG tablet Take 650 mg by mouth as needed.      Marland Kitchen b complex vitamins tablet Take 1 tablet by mouth daily.    . cetirizine (ZYRTEC) 10 MG tablet Take 10 mg by mouth daily.      . flecainide (TAMBOCOR) 50 MG tablet Take 1 tablet (50 mg total) by mouth 2 (two) times daily. 180 tablet 3  . fluticasone (FLONASE) 50 MCG/ACT nasal spray Place 2 sprays into the nose daily.    Marland Kitchen HYDROcodone-acetaminophen (NORCO/VICODIN) 5-325 MG tablet Take 1 tablet by mouth every 6  (six) hours as needed for severe pain. 10 tablet 0  . hydroxyurea (HYDREA) 500 MG capsule Take 500 mg by mouth 3 (three) times a week. May take with food to minimize GI side effects.     Marland Kitchen losartan (COZAAR) 100 MG tablet Take 100 mg by mouth daily.      . metFORMIN (GLUCOPHAGE) 500 MG tablet Take 500 mg by mouth daily.      . pravastatin (PRAVACHOL) 40 MG tablet Take 40 mg by mouth at bedtime.      . verapamil (CALAN-SR) 180 MG CR tablet Take 180 mg by mouth at bedtime.      Marland Kitchen warfarin (COUMADIN) 2.5 MG tablet Take as directed by coumadin clionic 210 tablet 1   No current facility-administered medications for this visit.    Allergies:  Dust mite extract   Social History: The patient  reports that he has been smoking Cigars.  He started smoking about 14 years ago. He has a 0.30 pack-year smoking history. He has never used smokeless tobacco. He reports that he drinks about 4.2 oz of alcohol per week . He reports that he does not use drugs.   ROS:  Please see the history of present illness. Otherwise, complete review of systems is positive for arthritic knee pain.  All other systems are reviewed and negative.   Physical Exam: VS:  BP 130/80   Pulse 73   Ht 6' (1.829 m)   Wt 235 lb (106.6 kg)   SpO2  97%   BMI 31.87 kg/m , BMI Body mass index is 31.87 kg/m.  Wt Readings from Last 3 Encounters:  10/10/15 235 lb (106.6 kg)  04/04/15 224 lb (101.6 kg)  10/01/14 218 lb 6.4 oz (99.1 kg)    Appears comfortable at rest.  HEENT: Conjunctiva and lids normal, oropharynx clear.  Neck: Supple, no carotid bruits, no elevated JVP.  Lungs: Clear to auscultation, nonlabored.  Cardiac: Regular rate and rhythm, no S3.  Skin: Warm and dry.  Extremities: No pitting edema. Distal pulses are full.   ECG: I personally reviewed the tracing from 04/04/2015 which showed sinus rhythm with prolonged PR interval and QRS 100 ms.  Recent Labwork:  January 2017: BUN 14, creatinine 0.7, potassium 4.2, AST  31, ALT 24, HgbA1c 5.8, cholesterol 161, triglycerides 104, HDL 33, LDL 107  Other Studies Reviewed Today:  GXT 04/06/2015:  Blood pressure demonstrated a hypertensive response to exercise.  There was no ST segment deviation noted during stress.  [facility] treadmill score indicative of a low risk for cardiac events.  Assessment and Plan:  1. Paroxysmal atrial fibrillation. Overall symptomatically stable. Continue present regimen including flecainide, verapamil, and Coumadin. ECG is stable. Follow-up GXT earlier this year was reassuring. I reviewed his lab work from January per Dr. Margo Common.  2. Essential hypertension, blood pressure is adequately controlled today. No changes were made.  Current medicines were reviewed with the patient today.   Orders Placed This Encounter  Procedures  . EKG 12-Lead    Disposition: Follow-up with me in 6 months.  Signed, Jonelle Sidle, MD, [facility] 10/10/2015 9:02 AM    [facility] at [facility] [address] [place], [place], [place] [address] Phone: [phone]; Fax: [phone]

## 2015-10-10 ENCOUNTER — Ambulatory Visit (INDEPENDENT_AMBULATORY_CARE_PROVIDER_SITE_OTHER): Payer: Medicare Other | Admitting: Cardiology

## 2015-10-10 ENCOUNTER — Encounter: Payer: Self-pay | Admitting: Cardiology

## 2015-10-10 VITALS — BP 130/80 | HR 73 | Ht 72.0 in | Wt 235.0 lb

## 2015-10-10 DIAGNOSIS — I10 Essential (primary) hypertension: Secondary | ICD-10-CM | POA: Diagnosis not present

## 2015-10-10 DIAGNOSIS — I480 Paroxysmal atrial fibrillation: Secondary | ICD-10-CM

## 2015-10-10 NOTE — Patient Instructions (Signed)

## 2015-10-15 ENCOUNTER — Other Ambulatory Visit: Payer: Self-pay | Admitting: Cardiology

## 2015-10-25 ENCOUNTER — Ambulatory Visit (INDEPENDENT_AMBULATORY_CARE_PROVIDER_SITE_OTHER): Payer: Medicare Other | Admitting: *Deleted

## 2015-10-25 DIAGNOSIS — Z5181 Encounter for therapeutic drug level monitoring: Secondary | ICD-10-CM | POA: Diagnosis not present

## 2015-10-25 DIAGNOSIS — I4891 Unspecified atrial fibrillation: Secondary | ICD-10-CM

## 2015-10-25 DIAGNOSIS — Z7901 Long term (current) use of anticoagulants: Secondary | ICD-10-CM

## 2015-10-25 DIAGNOSIS — I480 Paroxysmal atrial fibrillation: Secondary | ICD-10-CM

## 2015-10-25 LAB — POCT INR: INR: 2.8

## 2015-11-29 ENCOUNTER — Ambulatory Visit (INDEPENDENT_AMBULATORY_CARE_PROVIDER_SITE_OTHER): Payer: Medicare Other | Admitting: *Deleted

## 2015-11-29 DIAGNOSIS — I480 Paroxysmal atrial fibrillation: Secondary | ICD-10-CM

## 2015-11-29 DIAGNOSIS — I4891 Unspecified atrial fibrillation: Secondary | ICD-10-CM | POA: Diagnosis not present

## 2015-11-29 DIAGNOSIS — Z5181 Encounter for therapeutic drug level monitoring: Secondary | ICD-10-CM

## 2015-11-29 DIAGNOSIS — Z7901 Long term (current) use of anticoagulants: Secondary | ICD-10-CM

## 2015-11-29 LAB — POCT INR: INR: 2.2

## 2015-12-21 ENCOUNTER — Other Ambulatory Visit: Payer: Self-pay | Admitting: Cardiology

## 2016-01-10 ENCOUNTER — Ambulatory Visit (INDEPENDENT_AMBULATORY_CARE_PROVIDER_SITE_OTHER): Payer: Medicare Other | Admitting: *Deleted

## 2016-01-10 DIAGNOSIS — Z7901 Long term (current) use of anticoagulants: Secondary | ICD-10-CM | POA: Diagnosis not present

## 2016-01-10 DIAGNOSIS — I4891 Unspecified atrial fibrillation: Secondary | ICD-10-CM

## 2016-01-10 DIAGNOSIS — I480 Paroxysmal atrial fibrillation: Secondary | ICD-10-CM | POA: Diagnosis not present

## 2016-01-10 DIAGNOSIS — Z5181 Encounter for therapeutic drug level monitoring: Secondary | ICD-10-CM

## 2016-01-10 LAB — POCT INR: INR: 2

## 2016-03-01 ENCOUNTER — Ambulatory Visit (INDEPENDENT_AMBULATORY_CARE_PROVIDER_SITE_OTHER): Payer: Medicare Other | Admitting: *Deleted

## 2016-03-01 DIAGNOSIS — Z5181 Encounter for therapeutic drug level monitoring: Secondary | ICD-10-CM | POA: Diagnosis not present

## 2016-03-01 DIAGNOSIS — I480 Paroxysmal atrial fibrillation: Secondary | ICD-10-CM | POA: Diagnosis not present

## 2016-03-01 DIAGNOSIS — Z7901 Long term (current) use of anticoagulants: Secondary | ICD-10-CM | POA: Diagnosis not present

## 2016-03-01 DIAGNOSIS — I4891 Unspecified atrial fibrillation: Secondary | ICD-10-CM

## 2016-03-01 LAB — POCT INR: INR: 3

## 2016-03-22 ENCOUNTER — Other Ambulatory Visit: Payer: Self-pay | Admitting: Cardiovascular Disease

## 2016-04-05 IMAGING — DX DG FINGER THUMB 2+V*L*
3 series · 3 of 3 positions shown · non-contrast
Comparison: 05/26/2015

CLINICAL DATA: Probable metallic foreign body

EXAM:
LEFT THUMB 2+V

[finger obl]
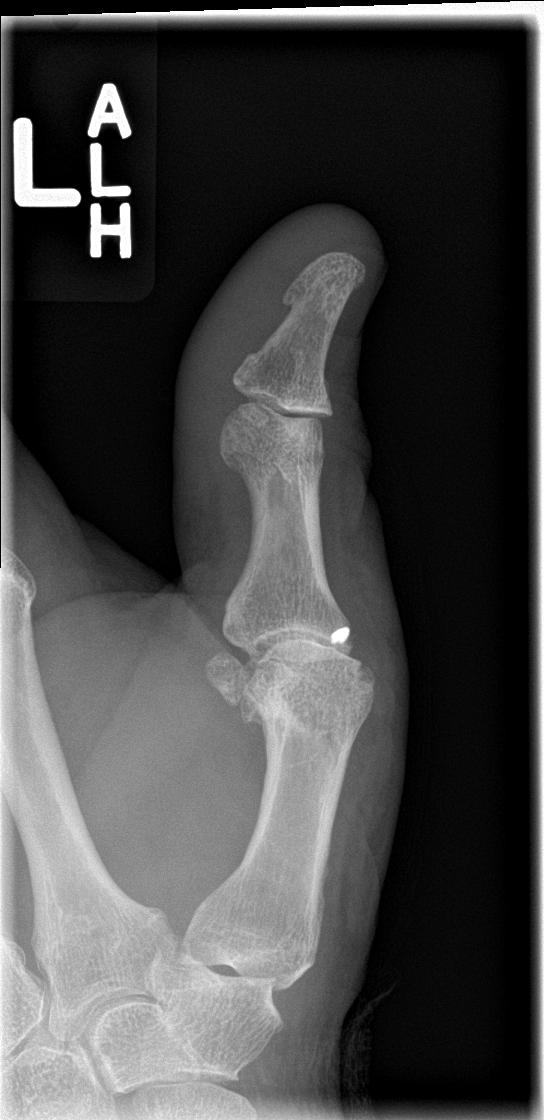

[finger lat]
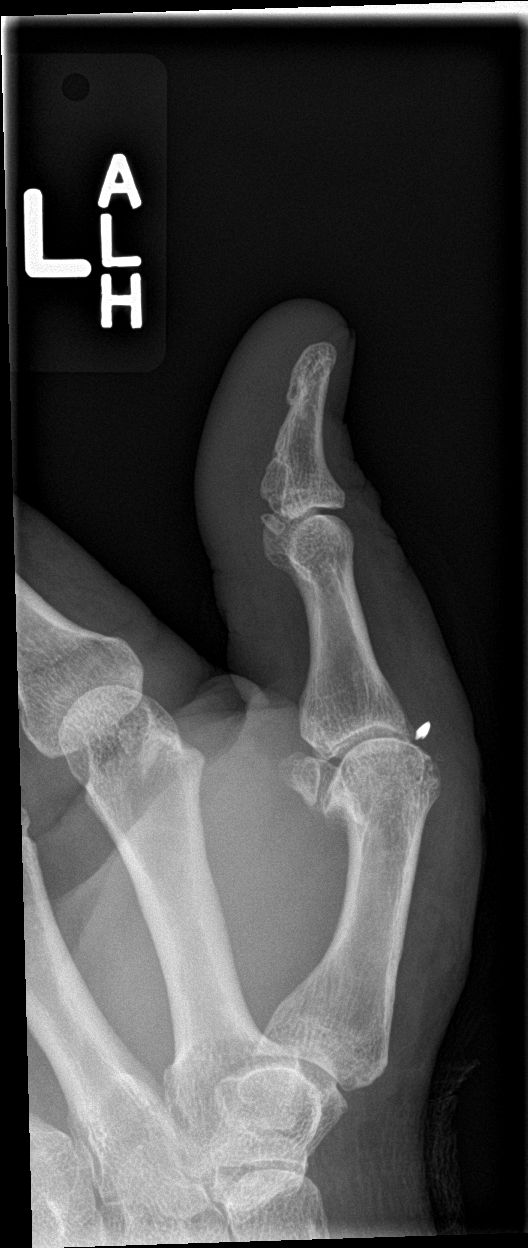

[finger ap]
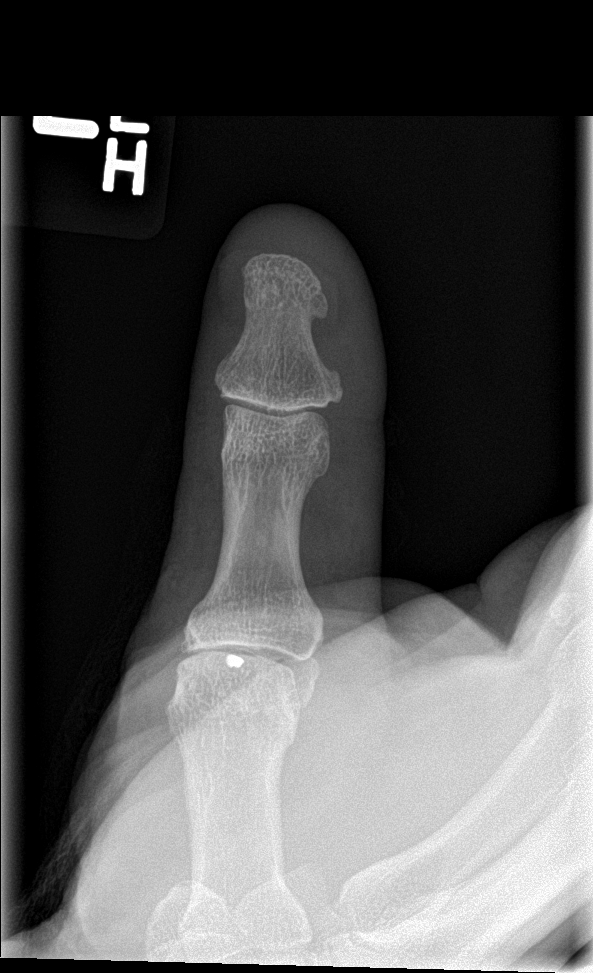

[3 of 3 positions shown; findings below may reference images not displayed]

FINDINGS: Three views of the left thumb submitted. Persistent metallic foreign
body dorsal aspect at the base of proximal phalanx adjacent to first
metacarpal phalangeal joint. Measures about 3.2 mm.
IMPRESSION: Persistent metallic foreign body dorsal aspect at the base of
proximal phalanx adjacent to first metacarpal phalangeal joint.

## 2016-04-09 NOTE — Progress Notes (Signed)
Cardiology Office Note  Date: 04/10/2016   ID: [name], DOB 02-27-1945, MRN [id number]  PCP: Louie Boston, MD  Primary Cardiologist: Nona Dell, MD   Chief Complaint  Patient presents with  . PAF    History of Present Illness: [name] is a 71 y.o. male last seen in August 2017. He presents for a follow-up visit. Reports 3 breathing episodes of palpitations in the last 6 months. Has not had to change his medications during this time. Otherwise no functional limitation related to shortness of breath or chest pain. He has had some arthritic left knee pain however.  He continues on Coumadin with follow-up in the anticoagulation clinic. No reported bleeding problems.  We went over his current medications. Cardiac regimen includes flecainide, Cozaar, Pravachol, Calan SR, and Coumadin.  GXT from last year was low risk. Lab work is being followed by Dr. Margo Common, I reviewed the recent numbers from January.  Past Medical History:  Diagnosis Date  . Atrial fibrillation [facility])    Ablation at [facility] 2009, Dr. Deno Lunger  . Diabetes mellitus type II   . Essential hypertension, benign   . ITP (idiopathic thrombocytopenic purpura) 2001  . Primary erythrocytosis     Past Surgical History:  Procedure Laterality Date  . HERNIA REPAIR    . ROTATOR CUFF REPAIR    . SPLENECTOMY, TOTAL  2001  . TONSILLECTOMY      Current Outpatient Prescriptions  Medication Sig Dispense Refill  . acetaminophen (TYLENOL) 325 MG tablet Take 650 mg by mouth as needed.      Marland Kitchen b complex vitamins tablet Take 1 tablet by mouth daily.    . cetirizine (ZYRTEC) 10 MG tablet Take 10 mg by mouth daily.      . flecainide (TAMBOCOR) 50 MG tablet TAKE 1 TABLET TWICE DAILY 180 tablet 3  . fluticasone (FLONASE) 50 MCG/ACT nasal spray Place 2 sprays into the nose daily.    Marland Kitchen HYDROcodone-acetaminophen (NORCO/VICODIN) 5-325 MG tablet Take 1 tablet by mouth every 6 (six) hours as needed for severe  pain. 10 tablet 0  . hydroxyurea (HYDREA) 500 MG capsule Take 500 mg by mouth 3 (three) times a week. May take with food to minimize GI side effects.     Marland Kitchen losartan (COZAAR) 100 MG tablet Take 100 mg by mouth daily.      . metFORMIN (GLUCOPHAGE) 500 MG tablet Take 500 mg by mouth daily.      . pravastatin (PRAVACHOL) 40 MG tablet Take 40 mg by mouth at bedtime.      . verapamil (CALAN-SR) 180 MG CR tablet Take 180 mg by mouth at bedtime.      Marland Kitchen warfarin (COUMADIN) 2.5 MG tablet TAKE AS DIRECTED BY COUMADIN CLINIC 210 tablet 1   No current facility-administered medications for this visit.    Allergies:  Dust mite extract   Social History: The patient  reports that he quit smoking about 12 years ago. His smoking use included Cigars. He started smoking about 15 years ago. He has a 0.30 pack-year smoking history. He has never used smokeless tobacco. He reports that he drinks about 4.2 oz of alcohol per week . He reports that he does not use drugs.   ROS:  Please see the history of present illness. Otherwise, complete review of systems is positive for left knee arthritic pains.  All other systems are reviewed and negative.   Physical Exam: VS:  BP 118/80   Pulse (!) 53  Ht 6' (1.829 m)   Wt 220 lb (99.8 kg)   SpO2 97%   BMI 29.84 kg/m , BMI Body mass index is 29.84 kg/m.  Wt Readings from Last 3 Encounters:  04/10/16 220 lb (99.8 kg)  10/10/15 235 lb (106.6 kg)  04/04/15 224 lb (101.6 kg)    Appears comfortable at rest.  HEENT: Conjunctiva and lids normal, oropharynx clear.  Neck: Supple, no carotid bruits, no elevated JVP.  Lungs: Clear to auscultation, nonlabored.  Cardiac: Regular rate and rhythm, no S3.  Skin: Warm and dry.  Extremities: No pitting edema. Distal pulses are full.  Skin: Warm and dry. Musculoskeletal: No kyphosis. Neuropsychiatric: Alert and oriented 3, affect appropriate.  ECG: I personally reviewed the tracing from 10/10/2015 which showed sinus rhythm  with prolonged PR interval and leftward axis. QRS duration 90 ms.  Recent Labwork:  January 2018: Cholesterol 167, triglycerides 139, HDL 35, LDL 104, hemoglobin A1c 5.9, BUN 15, creatinine 0.65, AST 31, ALT 24, potassium 4.3, hemoglobin 16.6, hematocrit 48.1, platelets 255  Other Studies Reviewed Today:  GXT 04/06/2015:  Blood pressure demonstrated a hypertensive response to exercise.  There was no ST segment deviation noted during stress.  [facility] treadmill score indicative of a low risk for cardiac events.  Assessment and Plan:  1. Paroxysmal atrial fibrillation status [facility], brief limited breakthrough episodes now on antiarrhythmic therapy. Plan to continue current regimen. GXT from last year was low risk and I reviewed his recent lab work.  2. Essential hypertension, on Cozaar with good control.  3. Left knee arthritic pain. Patient reports progression over time and is thinking about having a knee replacement. Cardiac status should not be a major limitation to him having knee surgery if needed.  4. Type 2 diabetes mellitus, on Glucophage and followed by Dr. Margo Commonapper. Recent hemoglobin A1c 5.9.  Current medicines were reviewed with the patient today.  Disposition: Follow-up in 6 months.  Signed, Jonelle SidleSamuel G. Anaija Wissink, MD, [facility] 04/10/2016 10:14 AM    [facility] [address], [place], [place] [address] Phone: [phone]) [phone]; Fax: [phone]) [phone]

## 2016-04-10 ENCOUNTER — Ambulatory Visit (INDEPENDENT_AMBULATORY_CARE_PROVIDER_SITE_OTHER): Payer: Medicare Other | Admitting: *Deleted

## 2016-04-10 ENCOUNTER — Encounter: Payer: Self-pay | Admitting: Cardiology

## 2016-04-10 ENCOUNTER — Ambulatory Visit (INDEPENDENT_AMBULATORY_CARE_PROVIDER_SITE_OTHER): Payer: Medicare Other | Admitting: Cardiology

## 2016-04-10 VITALS — BP 118/80 | HR 53 | Ht 72.0 in | Wt 220.0 lb

## 2016-04-10 DIAGNOSIS — Z7901 Long term (current) use of anticoagulants: Secondary | ICD-10-CM | POA: Diagnosis not present

## 2016-04-10 DIAGNOSIS — Z5181 Encounter for therapeutic drug level monitoring: Secondary | ICD-10-CM

## 2016-04-10 DIAGNOSIS — I10 Essential (primary) hypertension: Secondary | ICD-10-CM

## 2016-04-10 DIAGNOSIS — I480 Paroxysmal atrial fibrillation: Secondary | ICD-10-CM

## 2016-04-10 DIAGNOSIS — I4891 Unspecified atrial fibrillation: Secondary | ICD-10-CM

## 2016-04-10 DIAGNOSIS — E119 Type 2 diabetes mellitus without complications: Secondary | ICD-10-CM

## 2016-04-10 LAB — POCT INR: INR: 2.1

## 2016-04-10 NOTE — Patient Instructions (Signed)
Your physician wants you to follow-up in: 6 months with Dr. McDowell You will receive a reminder letter in the mail two months in advance. If you don't receive a letter, please call our office to schedule the follow-up appointment.  Your physician recommends that you continue on your current medications as directed. Please refer to the Current Medication list given to you today.  Thank you for choosing [facility]!!    

## 2016-05-22 ENCOUNTER — Ambulatory Visit (INDEPENDENT_AMBULATORY_CARE_PROVIDER_SITE_OTHER): Payer: Medicare Other | Admitting: *Deleted

## 2016-05-22 DIAGNOSIS — Z7901 Long term (current) use of anticoagulants: Secondary | ICD-10-CM | POA: Diagnosis not present

## 2016-05-22 DIAGNOSIS — I4891 Unspecified atrial fibrillation: Secondary | ICD-10-CM | POA: Diagnosis not present

## 2016-05-22 DIAGNOSIS — I480 Paroxysmal atrial fibrillation: Secondary | ICD-10-CM | POA: Diagnosis not present

## 2016-05-22 DIAGNOSIS — Z5181 Encounter for therapeutic drug level monitoring: Secondary | ICD-10-CM

## 2016-05-22 LAB — POCT INR: INR: 2.2

## 2016-07-03 ENCOUNTER — Ambulatory Visit (INDEPENDENT_AMBULATORY_CARE_PROVIDER_SITE_OTHER): Payer: Medicare Other | Admitting: *Deleted

## 2016-07-03 ENCOUNTER — Telehealth: Payer: Self-pay | Admitting: Cardiology

## 2016-07-03 DIAGNOSIS — I4891 Unspecified atrial fibrillation: Secondary | ICD-10-CM | POA: Diagnosis not present

## 2016-07-03 DIAGNOSIS — Z7901 Long term (current) use of anticoagulants: Secondary | ICD-10-CM

## 2016-07-03 DIAGNOSIS — Z5181 Encounter for therapeutic drug level monitoring: Secondary | ICD-10-CM

## 2016-07-03 DIAGNOSIS — I480 Paroxysmal atrial fibrillation: Secondary | ICD-10-CM

## 2016-07-03 LAB — POCT INR: INR: 1.8

## 2016-07-03 NOTE — Telephone Encounter (Signed)
Patient wanted to let Dr Diona BrownerMcDowell know that he will be having total knee replacement in July

## 2016-07-31 ENCOUNTER — Ambulatory Visit (INDEPENDENT_AMBULATORY_CARE_PROVIDER_SITE_OTHER): Payer: Medicare Other | Admitting: *Deleted

## 2016-07-31 DIAGNOSIS — I480 Paroxysmal atrial fibrillation: Secondary | ICD-10-CM | POA: Diagnosis not present

## 2016-07-31 DIAGNOSIS — I4891 Unspecified atrial fibrillation: Secondary | ICD-10-CM

## 2016-07-31 DIAGNOSIS — Z5181 Encounter for therapeutic drug level monitoring: Secondary | ICD-10-CM

## 2016-07-31 DIAGNOSIS — Z7901 Long term (current) use of anticoagulants: Secondary | ICD-10-CM

## 2016-07-31 LAB — POCT INR: INR: 3

## 2016-09-27 ENCOUNTER — Other Ambulatory Visit: Payer: Self-pay | Admitting: Cardiovascular Disease

## 2016-10-08 NOTE — Progress Notes (Signed)
Cardiology Office Note  Date: 10/09/2016   ID: Guy BeginWayne R Pham, DOB 01/30/1946, MRN [id number]  PCP: Louie Bostonapper, David B., MD  Primary Cardiologist: Nona DellSamuel Franciso Dierks, MD   Chief Complaint  Patient presents with  . PAF    History of Present Illness: Richard NeedyWayne R Pham is a 71 y.o. male last seen in February. He is here today with his wife. He did undergo a left total knee replacement since I saw him. He is planning to start outpatient rehabilitation soon. Still using crutches and has a compression sleeve on his left leg. He reports only brief palpitations, no obvious persistent atrial fibrillation around the time of surgery. He has had no major changes in his cardiac regimen.  He continues to follow in the anticoagulation clinic on Coumadin. Reports spontaneous bleeding problems. Anticoagulation is being followed by his PCP. [place] now [place] in [place].  Cardiac regimen includes Coumadin, verapamil, and flecainide.  Past Medical History:  Diagnosis Date  . Atrial fibrillation [facility](HCC)    [facility], Dr. Deno LungerHranitzky  . Diabetes mellitus type II   . Essential hypertension, benign   . ITP (idiopathic thrombocytopenic purpura) 2001  . Primary erythrocytosis     Past Surgical History:  Procedure Laterality Date  . HERNIA REPAIR    . ROTATOR CUFF REPAIR    . SPLENECTOMY, TOTAL  2001  . TONSILLECTOMY      Current Outpatient Prescriptions  Medication Sig Dispense Refill  . acetaminophen (TYLENOL) 325 MG tablet Take 650 mg by mouth as needed.      Marland Kitchen. b complex vitamins tablet Take 1 tablet by mouth daily.    . cetirizine (ZYRTEC) 10 MG tablet Take 10 mg by mouth daily.      . ferrous sulfate 325 (65 FE) MG tablet Take 325 mg by mouth daily with breakfast.    . flecainide (TAMBOCOR) 50 MG tablet TAKE 1 TABLET TWICE DAILY 180 tablet 3  . fluticasone (FLONASE) 50 MCG/ACT nasal spray Place 2 sprays into the nose daily.    Marland Kitchen. HYDROcodone-acetaminophen (NORCO/VICODIN) 5-325  MG tablet Take 1 tablet by mouth every 6 (six) hours as needed for severe pain. 10 tablet 0  . hydroxyurea (HYDREA) 500 MG capsule Take 500 mg by mouth 3 (three) times a week. May take with food to minimize GI side effects.     Marland Kitchen. losartan (COZAAR) 100 MG tablet Take 100 mg by mouth daily.      . metFORMIN (GLUCOPHAGE) 500 MG tablet Take 500 mg by mouth daily.      . Multiple Vitamin (MULTIVITAMIN) tablet Take 2 tablets by mouth daily. gummies without vitamin k    . pravastatin (PRAVACHOL) 40 MG tablet Take 40 mg by mouth at bedtime.      . verapamil (CALAN-SR) 180 MG CR tablet Take 180 mg by mouth at bedtime.      Marland Kitchen. warfarin (COUMADIN) 2.5 MG tablet TAKE AS DIRECTED BY COUMADIN CLINIC (Patient taking differently: TAKE AS DIRECTED BY COUMADIN CLINIC ([place] in [place] 210 tablet 3   No current facility-administered medications for this visit.    Allergies:  Dust mite extract   Social History: The patient  reports that he quit smoking about 12 years ago. His smoking use included Cigars. He started smoking about 15 years ago. He has a 0.30 pack-year smoking history. He has never used smokeless tobacco. He reports that he drinks about 4.2 oz of alcohol per week . He reports that he does  not use drugs.   ROS:  Please see the history of present illness. Otherwise, complete review of systems is positive for improving left knee swelling and joint mobility..  All other systems are reviewed and negative.   Physical Exam: VS:  BP 116/80   Pulse 90   Ht 6' (1.829 m)   Wt 212 lb 3.2 oz (96.3 kg)   SpO2 98%   BMI 28.78 kg/m , BMI Body mass index is 28.78 kg/m.  Wt Readings from Last 3 Encounters:  10/09/16 212 lb 3.2 oz (96.3 kg)  04/10/16 220 lb (99.8 kg)  10/10/15 235 lb (106.6 kg)    Appears comfortable at rest.  HEENT: Conjunctiva and lids normal, oropharynx clear.  Neck: Supple, no carotid bruits, no elevated JVP.  Lungs: Clear to auscultation, nonlabored.  Cardiac: Regular rate  and rhythm, no S3.  Skin: Warm and dry.  Extremities: Compression sleeve on left leg, well-healing anterior left knee incision with residual joint swelling. Distal pulses are full.  Skin: Warm and dry. Musculoskeletal: No kyphosis. Neuropsychiatric: Alert and oriented 3, affect appropriate.  ECG: I personally reviewed the tracing from 10/10/2015 which showed sinus rhythm with prolonged PR interval and leftward axis. QRS duration 90 ms.  Recent Labwork:  January 2018: Cholesterol 167, triglycerides 139, HDL 35, LDL 104, hemoglobin A1c 5.9, BUN 15, creatinine 0.65, AST 31, ALT 24, potassium 4.3, hemoglobin 16.6, hematocrit 48.1, platelets 255  Other Studies Reviewed Today:  [facility] 04/06/2015:  Blood pressure demonstrated a hypertensive response to exercise.  There was no ST segment deviation noted during stress.  [facility] treadmill score indicative of a low risk for cardiac events.  Assessment and Plan:  1. Paroxysmal atrial fibrillation status post ablation at [facility] and maintaining flecainide therapy due to breakthrough arrhythmia. He has had good rhythm control and we will continue with current plan. Coumadin now being followed by PCP.  2. Essential hypertension, also on Cozaar. Blood pressure is well controlled today.  3. Type 2 diabetes mellitus, continues on Glucophage. Hemoglobin A1c 5.9 in June. To follow-up with PCP.  Current medicines were reviewed with the patient today.  Disposition: Follow-up in 6 months.  Signed, Jonelle Sidle, MD, [facility] 10/09/2016 2:29 PM    [facility] [address] [place], [place], [place] [address] Phone: [phone]; Fax: [phone]

## 2016-10-09 ENCOUNTER — Ambulatory Visit (INDEPENDENT_AMBULATORY_CARE_PROVIDER_SITE_OTHER): Payer: Medicare Other | Admitting: Cardiology

## 2016-10-09 ENCOUNTER — Encounter: Payer: Self-pay | Admitting: Cardiology

## 2016-10-09 VITALS — BP 116/80 | HR 90 | Ht 72.0 in | Wt 212.2 lb

## 2016-10-09 DIAGNOSIS — I10 Essential (primary) hypertension: Secondary | ICD-10-CM

## 2016-10-09 DIAGNOSIS — I480 Paroxysmal atrial fibrillation: Secondary | ICD-10-CM

## 2016-10-09 DIAGNOSIS — E119 Type 2 diabetes mellitus without complications: Secondary | ICD-10-CM

## 2016-10-09 NOTE — Patient Instructions (Signed)

## 2016-12-27 ENCOUNTER — Other Ambulatory Visit: Payer: Self-pay | Admitting: Cardiology

## 2017-01-24 ENCOUNTER — Ambulatory Visit: Payer: Self-pay | Admitting: *Deleted

## 2017-01-24 DIAGNOSIS — Z5181 Encounter for therapeutic drug level monitoring: Secondary | ICD-10-CM

## 2017-01-24 DIAGNOSIS — I480 Paroxysmal atrial fibrillation: Secondary | ICD-10-CM
